# Patient Record
Sex: Female | Born: 1955 | Race: White | Hispanic: No | Marital: Married | Smoking: Never smoker
Health system: Southern US, Community
[De-identification: ages and names within clinical notes are randomized; demographics above are authoritative.]

## PROBLEM LIST (undated history)

## (undated) DIAGNOSIS — F419 Anxiety disorder, unspecified: Secondary | ICD-10-CM

## (undated) DIAGNOSIS — G43909 Migraine, unspecified, not intractable, without status migrainosus: Secondary | ICD-10-CM

## (undated) DIAGNOSIS — L570 Actinic keratosis: Secondary | ICD-10-CM

## (undated) DIAGNOSIS — G8929 Other chronic pain: Secondary | ICD-10-CM

## (undated) DIAGNOSIS — R519 Headache, unspecified: Secondary | ICD-10-CM

## (undated) DIAGNOSIS — R51 Headache: Secondary | ICD-10-CM

## (undated) HISTORY — DX: Other chronic pain: G89.29

## (undated) HISTORY — DX: Headache, unspecified: R51.9

## (undated) HISTORY — DX: Headache: R51

## (undated) HISTORY — DX: Actinic keratosis: L57.0

## (undated) HISTORY — DX: Migraine, unspecified, not intractable, without status migrainosus: G43.909

## (undated) HISTORY — DX: Anxiety disorder, unspecified: F41.9

---

## 1999-02-05 ENCOUNTER — Ambulatory Visit (HOSPITAL_COMMUNITY): Admission: RE | Admit: 1999-02-05 | Discharge: 1999-02-05 | Payer: Self-pay | Admitting: Urology

## 1999-02-05 ENCOUNTER — Encounter: Payer: Self-pay | Admitting: Urology

## 1999-02-14 ENCOUNTER — Encounter: Payer: Self-pay | Admitting: Urology

## 1999-02-14 ENCOUNTER — Ambulatory Visit (HOSPITAL_COMMUNITY): Admission: RE | Admit: 1999-02-14 | Discharge: 1999-02-14 | Payer: Self-pay | Admitting: Urology

## 1999-05-09 ENCOUNTER — Encounter: Payer: Self-pay | Admitting: Family Medicine

## 1999-05-09 LAB — CONVERTED CEMR LAB
RBC count: 4.13 10*6/uL
WBC, blood: 4.5 10*3/uL

## 2000-07-04 ENCOUNTER — Encounter: Payer: Self-pay | Admitting: Family Medicine

## 2000-07-04 LAB — CONVERTED CEMR LAB
RBC count: 4.17 10*6/uL
WBC, blood: 11.1 10*3/uL

## 2001-04-12 ENCOUNTER — Encounter: Payer: Self-pay | Admitting: Family Medicine

## 2001-04-12 LAB — CONVERTED CEMR LAB: Blood Glucose, Fasting: 91 mg/dL

## 2001-04-13 ENCOUNTER — Encounter: Payer: Self-pay | Admitting: Family Medicine

## 2001-04-13 LAB — CONVERTED CEMR LAB
RBC count: 3.98 10*6/uL
TSH: 1.72 microintl units/mL
WBC, blood: 11.7 10*3/uL

## 2001-06-03 ENCOUNTER — Encounter: Payer: Self-pay | Admitting: Family Medicine

## 2001-06-03 LAB — CONVERTED CEMR LAB: TSH: 1.74 microintl units/mL

## 2001-08-20 ENCOUNTER — Encounter: Payer: Self-pay | Admitting: Family Medicine

## 2001-08-20 LAB — CONVERTED CEMR LAB
RBC count: 4.14 10*6/uL
WBC, blood: 7.6 10*3/uL

## 2009-01-08 ENCOUNTER — Encounter: Payer: Self-pay | Admitting: Orthopedic Surgery

## 2009-01-29 ENCOUNTER — Encounter (INDEPENDENT_AMBULATORY_CARE_PROVIDER_SITE_OTHER): Payer: Self-pay | Admitting: Internal Medicine

## 2009-01-31 ENCOUNTER — Ambulatory Visit: Payer: Self-pay | Admitting: Family Medicine

## 2009-01-31 DIAGNOSIS — J309 Allergic rhinitis, unspecified: Secondary | ICD-10-CM | POA: Insufficient documentation

## 2009-01-31 DIAGNOSIS — R030 Elevated blood-pressure reading, without diagnosis of hypertension: Secondary | ICD-10-CM | POA: Insufficient documentation

## 2009-01-31 DIAGNOSIS — K5909 Other constipation: Secondary | ICD-10-CM | POA: Insufficient documentation

## 2009-02-06 ENCOUNTER — Encounter: Payer: Self-pay | Admitting: Family Medicine

## 2009-02-06 ENCOUNTER — Encounter: Payer: Self-pay | Admitting: Orthopedic Surgery

## 2009-02-06 DIAGNOSIS — G2581 Restless legs syndrome: Secondary | ICD-10-CM | POA: Insufficient documentation

## 2009-02-08 ENCOUNTER — Emergency Department: Payer: Self-pay | Admitting: Emergency Medicine

## 2009-03-08 ENCOUNTER — Encounter: Payer: Self-pay | Admitting: Orthopedic Surgery

## 2010-10-16 ENCOUNTER — Encounter: Payer: Self-pay | Admitting: Family Medicine

## 2010-11-05 NOTE — Letter (Signed)
Summary: [facility]   Imported By: Lanelle Bal 10/29/2010 13:09:14  _____________________________________________________________________  External Attachment:    Type:   Image     Comment:   External Document

## 2010-11-14 ENCOUNTER — Encounter: Payer: Self-pay | Admitting: Orthopedic Surgery

## 2010-12-08 ENCOUNTER — Encounter: Payer: Self-pay | Admitting: Orthopedic Surgery

## 2011-01-07 ENCOUNTER — Encounter: Payer: Self-pay | Admitting: Orthopedic Surgery

## 2011-06-23 ENCOUNTER — Encounter: Payer: Self-pay | Admitting: Orthopedic Surgery

## 2011-07-10 ENCOUNTER — Encounter: Payer: Self-pay | Admitting: Orthopedic Surgery

## 2011-08-09 ENCOUNTER — Encounter: Payer: Self-pay | Admitting: Orthopedic Surgery

## 2011-10-20 ENCOUNTER — Encounter: Payer: Self-pay | Admitting: Family Medicine

## 2011-10-20 DIAGNOSIS — G4700 Insomnia, unspecified: Secondary | ICD-10-CM | POA: Insufficient documentation

## 2012-02-17 ENCOUNTER — Ambulatory Visit: Payer: Self-pay | Admitting: Internal Medicine

## 2012-06-29 ENCOUNTER — Ambulatory Visit: Payer: Self-pay

## 2012-07-15 HISTORY — PX: CHOLECYSTECTOMY: SHX55

## 2013-08-23 ENCOUNTER — Ambulatory Visit: Payer: Self-pay | Admitting: Internal Medicine

## 2014-03-31 ENCOUNTER — Ambulatory Visit: Payer: Self-pay | Admitting: Physical Medicine and Rehabilitation

## 2014-08-22 ENCOUNTER — Ambulatory Visit: Payer: Self-pay | Admitting: Internal Medicine

## 2015-02-08 ENCOUNTER — Other Ambulatory Visit: Payer: Self-pay | Admitting: Neurology

## 2015-02-08 DIAGNOSIS — M542 Cervicalgia: Secondary | ICD-10-CM

## 2015-02-16 ENCOUNTER — Ambulatory Visit
Admission: RE | Admit: 2015-02-16 | Discharge: 2015-02-16 | Disposition: A | Discharge status: Home / Self Care | Payer: 59 | Source: Ambulatory Visit | Attending: Neurology | Admitting: Neurology

## 2015-02-16 DIAGNOSIS — R202 Paresthesia of skin: Secondary | ICD-10-CM | POA: Diagnosis not present

## 2015-02-16 DIAGNOSIS — M5412 Radiculopathy, cervical region: Secondary | ICD-10-CM | POA: Insufficient documentation

## 2015-02-16 DIAGNOSIS — M542 Cervicalgia: Secondary | ICD-10-CM

## 2015-04-05 ENCOUNTER — Ambulatory Visit: Payer: 59 | Attending: Physician Assistant

## 2015-04-05 DIAGNOSIS — M25552 Pain in left hip: Secondary | ICD-10-CM | POA: Insufficient documentation

## 2015-04-05 DIAGNOSIS — M7602 Gluteal tendinitis, left hip: Secondary | ICD-10-CM | POA: Diagnosis not present

## 2015-04-06 NOTE — Therapy (Signed)
[place] [facility] [address], [place], [address] Phone: [phone]   Fax:  [phone]  Physical Therapy Evaluation  Patient Details  Name: Alejandra Ward MRN: [id number] Date of Birth: 08/18/1956 Referring Provider:  Jarome Lamas, PA-C  Encounter Date: 04/05/2015      PT End of Session - 04/06/15 0947    Visit Number 1   Number of Visits 12   Date for PT Re-Evaluation 05/18/15   Authorization Type no g codes   PT Start Time 0800   PT Stop Time 0900   PT Time Calculation (min) 60 min   Activity Tolerance Patient limited by pain   Behavior During Therapy Anxious      Past Medical History  Diagnosis Date  . Anxiety   . Chronic headaches   . Migraines     History reviewed. No pertinent past surgical history.  There were no vitals filed for this visit.  Visit Diagnosis:  Gluteal tendinitis of left buttock - Plan: PT plan of care cert/re-cert  Hip pain, acute, left - Plan: PT plan of care cert/re-cert      Subjective Assessment - 04/05/15 0848    Subjective "My L hip hurts really bad"   Pertinent History Pt is a frequent daily walker. She reports that 2 weeks ago she walked 4 miles and the next morning she woke up and couldn't stand up due to severe L hip pain. Pain progressively worsened until she finally decided to go see MD. She went to [facility] on 04/03/15 Mobic and they injected her with Kenalog and prescribed Mobic. MD requested pt to return in 4 weeks for MRI if hip pain does not improve. Pt complains of severe L hip pain today but improving on the Mobic. History of bilateral SI and neck pain. Pt reports recent neck MRI which showed bone spurs. Pt denies numbness/tingling in either LE. Denies back pain. States the night when they performed the Kenalog injection she had some L foot numbness but it has subsided. Aggravating factors: sitting, sit to stand, heat, bending, and standing for  extended period of time. Easing factors: Mobic, ice, changing position. Pain is worse first thing in the AM and improves as the day progresses. Pain does not wake her up at night as long as she is taking the Mobic. ROS negative for red flags.    How long can you sit comfortably? 30 minutes if she is taking Mobic   How long can you walk comfortably? 15 minutes   Diagnostic tests None   Patient Stated Goals Decrease pain and return to walking program.    Currently in Pain? Yes   Pain Score 5    Pain Location Buttocks   Pain Orientation Left   Pain Descriptors / Indicators Sharp   Pain Type Acute pain   Pain Onset 1 to 4 weeks ago   Pain Frequency Constant   Multiple Pain Sites No            [facility] PT Assessment - 04/06/15 0957    Assessment   Medical Diagnosis Gluteal tendinitis of L buttock   Onset Date/Surgical Date 03/22/15   Next MD Visit Unscheduled, follow-up as needed   Prior Therapy Yes for bilateral SIJ pain, not for current condition   Precautions   Precautions None   Restrictions   Weight Bearing Restrictions No   Balance Screen   Has the patient fallen in the past 6 months No  Home Environment   Living Environment Private residence   Living Arrangements Spouse/significant other   Available Help at Discharge Family   Type of [place] to enter   Entrance Stairs-Number of Steps 2   Entrance Stairs-Rails None   Home Layout Two level   Alternate Level Stairs-Number of Steps 12   Alternate Level Stairs-Rails Left   Prior Function   Level of Independence Independent   Vocation Part time employment   Chief Technology Officer   Cognition   Overall Cognitive Status Within Functional Limits for tasks assessed   Observation/Other Assessments   Observations No gross abnormalities in posture noted   Other Surveys  Other Surveys   Lower Extremity Functional Scale  23   Sensation   Light Touch Appears Intact   Tone   Assessment Location Other  (comment)   Tone Assessment - Other   Other Tone Location Comments No clonus, spasticity, or rigidity noted in bilateral UE/LE. Negative Hoffman.   ROM / Strength   AROM / PROM / Strength Strength   Strength   Overall Strength Comments Unable to fully assess due to pain but is functional and appears intact. Painful resisted L hip ER and painful passive L hip IR. IR/ER is grossly symmetrical bilateral. Knee and ankle AROM is grossly WNL. LE strength is at least 4 to 4+/5 bilateral without focal weakness noted.   Flexibility   Soft Tissue Assessment /Muscle Length yes   Hamstrings Approximately 70 degrees bilateral and painless. L knee to chest painful   Palpation   Spinal mobility Lumbar CPA testing is painless and relatively normal. Sacral thrust is negative. Unable to tolerate sacral distraction. No pain over SIJ or forntin area bilaterally with palpation.    Palpation comment Painful palpation along L posterolateral hip in area of glut med/max. Pain with palpation of deep L hip external rotators. Pt reports radiating pain to anterior L hip. Pt with poor tolerance to palpation.    Ambulation/Gait   Gait Comments L antalgic gait pattern   Balance   Balance Assessed No                   [facility] Adult PT Treatment/Exercise - 04/06/15 1010    Modalities   Modalities Electrical Stimulation   Electrical Stimulation   Electrical Stimulation Location Posterolateral L buttock, 2 leads   Electrical Stimulation Action HiVolt to reduce muscle spasms   Electrical Stimulation Parameters 115-120V, 20 minutes with concurrent MHP   Electrical Stimulation Goals Pain                PT Education - 04/06/15 0946    Education provided Yes   Education Details Educated about pain-free gentle piriformis stretch. Pt elects not to perform at home. Education about pain control.    Person(s) Educated Patient   Methods Explanation;Demonstration   Comprehension Verbalized understanding;Need  further instruction             PT Long Term Goals - 04/06/15 0955    PT LONG TERM GOAL #1   Title Pt will report full resolution of L hip pain with all exercise in order to return to walking program pain free by 05/18/15   Status New   PT LONG TERM GOAL #2   Title Pt will be independent with HEP for self-management of L hip pain by 05/18/15   Status New   PT LONG TERM GOAL #3   Title Pt will demonstrate increase in LEFS  by at least 9 points to demonstrate clinically significant change in pain and improvement in function at home by 05/18/15   Baseline 04/05/15: 23/80   Status New               Plan - 04/06/15 0949    Clinical Impression Statement Pt is a 59 year-old Caucasian female referred for gluteal tendinitis of left buttock. PT examination is somewhat limite due to high level of pain and poor tolerance by patient. Pt does have pain will palpation along L posterolateral and posterior buttock including deep external rotators. Pain with resisted L hip external rotation and passive L hip internal rotation. Pt will benefit from skilled PT services for pain control until she can progress to hip abductor and external rotator strengthening.    Pt will benefit from skilled therapeutic intervention in order to improve on the following deficits Abnormal gait;Pain   Rehab Potential Good   Clinical Impairments Affecting Rehab Potential Positive: motivation, active lifestyle; Negative: poor pain tolerance   PT Frequency 2x / week   PT Duration 4 weeks   PT Treatment/Interventions Aquatic Therapy;Electrical Stimulation;Cryotherapy;Iontophoresis 4mg /ml Dexamethasone;Moist Heat;Traction;Ultrasound;Gait training;Stair training;Therapeutic activities;Therapeutic exercise;Balance training;Neuromuscular re-education;Manual techniques;Passive range of motion   PT Next Visit Plan Pain control modalities until pt can tolerate gentle ROM of hip progressing toward strengthening   PT Home Exercise Plan  Gentle, pain-free supine L knee to chest and L hip IR stretch, pt refuses   Consulted and Agree with Plan of Care Patient         Problem List Patient Active Problem List   Diagnosis Date Noted  . Insomnia 10/20/2011  . RESTLESS LEGS SYNDROME 02/06/2009  . ALLERGIC RHINITIS 01/31/2009  . CONSTIPATION, RECURRENT 01/31/2009  . ELEVATED BLOOD PRESSURE WITHOUT DIAGNOSIS OF HYPERTENSION 01/31/2009   Phillips Grout PT, DPT   Huprich,Jason 04/06/2015, 10:13 AM  [place] [facility] [address] [place], [address] Phone: [phone]   Fax:  [phone]

## 2015-04-09 ENCOUNTER — Encounter: Payer: Self-pay | Admitting: Physical Therapy

## 2015-04-09 ENCOUNTER — Ambulatory Visit: Payer: 59 | Attending: Physician Assistant | Admitting: Physical Therapy

## 2015-04-09 DIAGNOSIS — M6281 Muscle weakness (generalized): Secondary | ICD-10-CM | POA: Insufficient documentation

## 2015-04-09 DIAGNOSIS — M25552 Pain in left hip: Secondary | ICD-10-CM

## 2015-04-10 NOTE — Therapy (Signed)
[place] [facility] [address], [place], [address] Phone: [phone]   Fax:  [phone]  Physical Therapy Treatment  Patient Details  Name: Alejandra Ward MRN: [id number] Date of Birth: 26-Apr-1956 Referring Provider:  Jarome Lamas, PA-C  Encounter Date: 04/09/2015      PT End of Session - 04/09/15 1700    Visit Number 2   Number of Visits 12   Date for PT Re-Evaluation 05/18/15   PT Start Time 6283   PT Stop Time 1652   PT Time Calculation (min) 39 min   Activity Tolerance Patient tolerated treatment well   Behavior During Therapy [facility] for tasks assessed/performed      Past Medical History  Diagnosis Date  . Anxiety   . Chronic headaches   . Migraines     History reviewed. No pertinent past surgical history.  There were no vitals filed for this visit.  Visit Diagnosis:  Hip pain, acute, left  Muscle weakness      Subjective Assessment - 04/09/15 1617    Subjective Patient reports she is "much better" and is now able to walk more. She was able to walk about a mile on the treadmill ~3 mph (25 min.). She is decreasing medication dosage of Mobic and that seems to be doing well without increased pain.    Limitations Sitting   How long can you walk comfortably? 25 min.    Patient Stated Goals Decrease pain and return to walking program.    Currently in Pain? Yes   Pain Score 2    Pain Location Other (Comment)  left lateral hip into groin area   Pain Orientation Left   Pain Descriptors / Indicators Aching   Pain Type Acute pain   Pain Onset More than a month ago   Pain Frequency Intermittent   Multiple Pain Sites No     Objective Gait: WNL's without antalgic pattern noted today ROM/flexibiltiy assessment: mild decreased ER and hip abduction left as compared to right Strength: able to perform hip abduction and ER in side lying with left hip today (no resistance) with mild soreness reported  (could not raise left LE previous session due to pain)      [facility] Adult PT Treatment/Exercise - 04/09/15 1623    Exercises   Exercises Other Exercises   Other Exercises  Side lying clam with manual resistance x 10 reps, standing hip extension and abduction 3 x 5 reps, hip extension with bridging x 10 with ball between knees with verbal cuing   Modalities   Modalities Ultrasound x 10 min. Pulsed 2 1.4 w/cm2 to lateral aspect left hip with patient in side lying right with pillow between knees. Goals: pain, muscle spasms    Manual techniques: soft tissue mobilization lateral left hip with patient in side lying right: superficial techniques only, increased spasms palpable, followed by [place]   Patient response to treatment: improved soft tissue elasticity, decreased spasms and improved strength with less soreness noted in left hip with repetition and guided motion         PT Education - 04/09/15 1700    Education provided Yes   Education Details Instructed in progression of exercises for stabilization of core, begin increasing ROM and strengthening of left hip   Person(s) Educated Patient   Methods Explanation;Verbal cues;Handout   Comprehension Verbalized understanding;Returned demonstration;Verbal cues required             PT Long Term Goals -  04/06/15 0955    PT LONG TERM GOAL #1   Title Pt will report full resolution of L hip pain with all exercise in order to return to walking program pain free by 05/18/15   Status New   PT LONG TERM GOAL #2   Title Pt will be independent with HEP for self-management of L hip pain by 05/18/15   Status New   PT LONG TERM GOAL #3   Title Pt will demonstrate increase in LEFS by at least 9 points to demonstrate clinically significant change in pain and improvement in function at home by 05/18/15   Baseline 04/05/15: 23/80   Status New               Plan - 04/09/15 1701    Clinical Impression Statement Patient is much improved with decreased  left hip pain from previous session. She is now able to progress with exercises and return to prior level of exercises with guided progression and modification as indicated. She responded well to treatment with [place] and exercises and should continue to steadily improve with addiitional physical therapy intervention.    Pt will benefit from skilled therapeutic intervention in order to improve on the following deficits Pain;Decreased strength   Rehab Potential Good   PT Frequency 2x / week   PT Duration 4 weeks   PT Treatment/Interventions Patient/family education;Ultrasound;Moist Heat;Electrical Stimulation;Therapeutic exercise;Manual techniques;Cryotherapy   PT Next Visit Plan pain control, manual therapy STM, progress therapeutic exercises for core/hip strengthening/flexiblity        Problem List Patient Active Problem List   Diagnosis Date Noted  . Insomnia 10/20/2011  . RESTLESS LEGS SYNDROME 02/06/2009  . ALLERGIC RHINITIS 01/31/2009  . CONSTIPATION, RECURRENT 01/31/2009  . ELEVATED BLOOD PRESSURE WITHOUT DIAGNOSIS OF HYPERTENSION 01/31/2009    Jomarie Longs PT 04/10/2015, 3:06 PM  [place] [facility] [address] [place], [address] Phone: [phone]   Fax:  [phone]

## 2015-04-12 ENCOUNTER — Encounter: Payer: Self-pay | Admitting: Physical Therapy

## 2015-04-12 ENCOUNTER — Ambulatory Visit: Payer: 59 | Admitting: Physical Therapy

## 2015-04-12 DIAGNOSIS — M6281 Muscle weakness (generalized): Secondary | ICD-10-CM

## 2015-04-12 DIAGNOSIS — M25552 Pain in left hip: Secondary | ICD-10-CM

## 2015-04-12 NOTE — Therapy (Signed)
[place] [facility] [address], [place], [address] Phone: [phone]   Fax:  [phone]  Physical Therapy Treatment  Patient Details  Name: Alejandra Ward MRN: [id number] Date of Birth: 11-Nov-1955 Referring Provider:  Jarome Lamas, PA-C  Encounter Date: 04/12/2015      PT End of Session - 04/12/15 1021    Visit Number 3   Number of Visits 12   Date for PT Re-Evaluation 05/18/15   PT Start Time 0948   PT Stop Time 1019   PT Time Calculation (min) 31 min   Activity Tolerance Patient tolerated treatment well   Behavior During Therapy [facility] for tasks assessed/performed      Past Medical History  Diagnosis Date  . Anxiety   . Chronic headaches   . Migraines     History reviewed. No pertinent past surgical history.  There were no vitals filed for this visit.  Visit Diagnosis:  Hip pain, acute, left  Muscle weakness      Subjective Assessment - 04/12/15 0951    Subjective Paitent continues to see great improvement with decreased pain in lateral aspect of left hip. She has walked 2 miles today already and is having only soreness today 2/10.    Currently in Pain? Yes   Pain Score 2    Pain Location Hip   Pain Orientation Left   Pain Descriptors / Indicators Aching;Sore   Pain Type Acute pain   Pain Onset More than a month ago   Pain Frequency Intermittent      Objective: Strength: decreased hip abduction and ER strength with soreness noted left hip        [facility] Adult PT Treatment/Exercise - 04/12/15 0953    Exercises   Exercises Other Exercises   Other Exercises  Side lying clam x 10 reps each side, standing hip extension and abduction 3 x 5 reps, Treadmill x 2 min. To observe gait pattern with equal weight shift and step length noted, side stepping along foam balance beam x 2 min., diagonal walk x 10 steps with verbal cuing and demonstration   Modalities   Modalities ;Ultrasound  (performed following exercises)   Ultrasound   Ultrasound Location left hip/lateral aspect into gluteal region   Ultrasound Parameters 1MHz 100% @ 1.4w/cm2 with patient side lying right with pillow between knees      Patient response to treatment: good technique with exercises with demonstration/verbal cuing, sore in left hip with all exercises, decreased soreness following [place] to mild and able to tolerate increased resistance into ER following [place]               PT Long Term Goals - 04/06/15 0955    PT LONG TERM GOAL #1   Title Pt will report full resolution of L hip pain with all exercise in order to return to walking program pain free by 05/18/15   Status New   PT LONG TERM GOAL #2   Title Pt will be independent with HEP for self-management of L hip pain by 05/18/15   Status New   PT LONG TERM GOAL #3   Title Pt will demonstrate increase in LEFS by at least 9 points to demonstrate clinically significant change in pain and improvement in function at home by 05/18/15   Baseline 04/05/15: 23/80   Status New               Plan - 04/12/15 1023    Clinical  Impression Statement Patient continues with weakness and pain in left hip with good response to [place] and progressing with exercises without resistance.    Pt will benefit from skilled therapeutic intervention in order to improve on the following deficits Pain;Decreased strength   Rehab Potential Good   PT Frequency 2x / week   PT Duration 4 weeks   PT Treatment/Interventions Patient/family education;Ultrasound;Moist Heat;Electrical Stimulation;Therapeutic exercise;Manual techniques;Cryotherapy   PT Next Visit Plan pain control, manual therapy STM, progress therapeutic exercises for core/hip strengthening/flexiblity        Problem List Patient Active Problem List   Diagnosis Date Noted  . Insomnia 10/20/2011  . RESTLESS LEGS SYNDROME 02/06/2009  . ALLERGIC RHINITIS 01/31/2009  . CONSTIPATION, RECURRENT 01/31/2009  .  ELEVATED BLOOD PRESSURE WITHOUT DIAGNOSIS OF HYPERTENSION 01/31/2009    Jomarie Longs PT 04/12/2015, 10:24 AM  [place] [facility] [address], [place], [address] Phone: [phone]   Fax:  [phone]

## 2015-04-16 ENCOUNTER — Ambulatory Visit: Payer: 59 | Attending: Physician Assistant | Admitting: Physical Therapy

## 2015-04-16 ENCOUNTER — Encounter: Payer: Self-pay | Admitting: Physical Therapy

## 2015-04-16 ENCOUNTER — Encounter: Payer: 59 | Admitting: Physical Therapy

## 2015-04-16 DIAGNOSIS — M25552 Pain in left hip: Secondary | ICD-10-CM | POA: Insufficient documentation

## 2015-04-16 DIAGNOSIS — M6281 Muscle weakness (generalized): Secondary | ICD-10-CM | POA: Insufficient documentation

## 2015-04-16 NOTE — Therapy (Signed)
[place] [facility] [address], [place], [address] Phone: [phone]   Fax:  [phone]  Physical Therapy Treatment  Patient Details  Name: Alejandra Ward MRN: [id number] Date of Birth: 03/11/1956 Referring Provider:  Jarome Lamas, PA-C  Encounter Date: 04/16/2015      PT End of Session - 04/16/15 1937    Visit Number 4   Number of Visits 12   Date for PT Re-Evaluation 05/18/15   PT Start Time 1855   PT Stop Time 1935   PT Time Calculation (min) 40 min   Activity Tolerance Patient tolerated treatment well   Behavior During Therapy [facility] for tasks assessed/performed      Past Medical History  Diagnosis Date  . Anxiety   . Chronic headaches   . Migraines     History reviewed. No pertinent past surgical history.  There were no vitals filed for this visit.  Visit Diagnosis:  Hip pain, acute, left  Muscle weakness      Subjective Assessment - 04/16/15 1857    Subjective Patient reports she is feeling better overall in left hip and LE. She did do some light aerobics Saturday and had a flare up with right knee with swelling.    Limitations Sitting   Patient Stated Goals Decrease pain and return to walking program.    Currently in Pain? Yes   Pain Score 3    Pain Location Hip   Pain Orientation Left   Pain Descriptors / Indicators Aching;Sore   Pain Type Acute pain   Pain Onset More than a month ago   Pain Frequency Intermittent       Objective: Palpation: left lateral hip/gluteal muscle: + spasm with TP in gluteus medius muscle Strength: decreased strength by 30% in left hip abduction and ER as compared to right hip       [facility] Adult PT Treatment/Exercise - 04/16/15 2226    Exercises   Exercises Other Exercises   Other Exercises  standing diagonal walk with 3# weights x 2 min. TKE in stanidng with green resistive band, side lying clam with mild resistance x 10 reps, assisted side lying  left hip abduction with hold at end range and eccentric conrol with assistance through 50% ROM x 3-5 reps   Modalities   Modalities Ultrasound   Ultrasound   Ultrasound Location left lateral hip/gluteal muscles   Ultrasound Parameters 3MHz pulsed 20% over TP's 0.8w/cm2 x 3 min. and 1MHz pulsed @ 50% over lateral aspect of left hip/gluteal region x 10 min   Ultrasound Goals Pain   Manual Therapy   Manual Therapy Soft tissue mobilization   Soft tissue mobilization left hip/gluteal muscles with concentration on gluteus medius muscle      Patient response to treatment: decreased spasms with 50% less tenderness elicited following [place] treatment, allowing STM to improve elasticity, overall reported soreness in left hip following session; patient required verbal cuing and assistance to complete exercises for ER and hip abduction in side lying position          PT Education - 04/16/15 1935    Education provided Yes   Education Details instructed in short arc exercises for TKE and side lying hip abduciton    Person(s) Educated Patient   Methods Explanation;Demonstration;Verbal cues   Comprehension Verbalized understanding;Returned demonstration;Verbal cues required             PT Long Term Goals - 04/06/15 0955    PT  LONG TERM GOAL #1   Title Pt will report full resolution of L hip pain with all exercise in order to return to walking program pain free by 05/18/15   Status New   PT LONG TERM GOAL #2   Title Pt will be independent with HEP for self-management of L hip pain by 05/18/15   Status New   PT LONG TERM GOAL #3   Title Pt will demonstrate increase in LEFS by at least 9 points to demonstrate clinically significant change in pain and improvement in function at home by 05/18/15   Baseline 04/05/15: 23/80   Status New               Plan - 04/16/15 1940    Clinical Impression Statement Patient demonstrates good progress towards goals for left hip pain and spasms. She  demonstrated decresased pain to mild and decreased muscle spasms by >50% with [place] and STM. She is still weak and has decreased control with left hip abduciton and will require additional psysical therapy intervention to return to prior level of function.    Pt will benefit from skilled therapeutic intervention in order to improve on the following deficits Pain;Decreased strength   Rehab Potential Good   PT Frequency 2x / week   PT Duration 4 weeks   PT Treatment/Interventions Patient/family education;Ultrasound;Moist Heat;Electrical Stimulation;Therapeutic exercise;Manual techniques;Cryotherapy   PT Next Visit Plan pain control, manual therapy STM, progress therapeutic exercises for core/hip strengthening/flexiblity        Problem List Patient Active Problem List   Diagnosis Date Noted  . Insomnia 10/20/2011  . RESTLESS LEGS SYNDROME 02/06/2009  . ALLERGIC RHINITIS 01/31/2009  . CONSTIPATION, RECURRENT 01/31/2009  . ELEVATED BLOOD PRESSURE WITHOUT DIAGNOSIS OF HYPERTENSION 01/31/2009    Jomarie Longs PT 04/16/2015, 10:48 PM  [place] [facility] [address] [place], [address] Phone: [phone]   Fax:  [phone]

## 2015-04-18 ENCOUNTER — Encounter: Payer: 59 | Admitting: Physical Therapy

## 2015-04-19 ENCOUNTER — Encounter: Payer: Self-pay | Admitting: Physical Therapy

## 2015-04-19 ENCOUNTER — Ambulatory Visit: Payer: 59 | Admitting: Physical Therapy

## 2015-04-19 DIAGNOSIS — M25552 Pain in left hip: Secondary | ICD-10-CM

## 2015-04-19 DIAGNOSIS — M6281 Muscle weakness (generalized): Secondary | ICD-10-CM

## 2015-04-20 ENCOUNTER — Encounter: Payer: 59 | Admitting: Physical Therapy

## 2015-04-20 NOTE — Therapy (Signed)
[facility] [address], [place], [address] Phone: [phone]   Fax:  [phone]  Physical Therapy Treatment  Patient Details  Name: Alejandra Ward MRN: [id number] Date of Birth: 12/26/1955 Referring Provider:  Jarome Lamas, PA-C  Encounter Date: 04/19/2015      PT End of Session - 04/19/15 1620    Visit Number 5   Number of Visits 12   Date for PT Re-Evaluation 05/18/15   PT Start Time 0258   PT Stop Time 1620   PT Time Calculation (min) 36 min   Activity Tolerance Patient tolerated treatment well   Behavior During Therapy [facility] for tasks assessed/performed      Past Medical History  Diagnosis Date  . Anxiety   . Chronic headaches   . Migraines     History reviewed. No pertinent past surgical history.  There were no vitals filed for this visit.  Visit Diagnosis:  Hip pain, acute, left  Muscle weakness      Subjective Assessment - 04/19/15 1552    Subjective Patient reports she is feeling better overall in left hip and LE. She is still sore in left hip and feels overall a lot of improvement since injection and with therapy intervention.   Patient Stated Goals Decrease pain and return to walking program.    Currently in Pain? Yes   Pain Score 3    Pain Location Hip   Pain Orientation Left   Pain Descriptors / Indicators Aching;Sore   Pain Type Acute pain   Pain Onset More than a month ago          [facility] Adult PT Treatment/Exercise - 04/19/15 1552    Exercises   Exercises Other Exercises   Other Exercises  Re assessed home program: standing diagonal walk with 3# weights x 2 min. TKE in stanidng with green resistive band, side lying clam with mild resistance x 10 reps, assisted side lying left hip abduction with hold at end range and eccentric conrol with assistance through 50% ROM x 3-5 reps Added hip abduction isometric exercise against wall with demonstration and verbal cuing 5  x 5 seconds each LE   Modalities   Modalities Ultrasound   Ultrasound   Ultrasound parameters: Goals 1MHZ pulsed 50% x 10 min. @ 1.4w/cm2 to lateral left hip/gluteus maximus/medius region with patient in right side lying with pillow between knees Pain   Manual Therapy   Manual Therapy Soft tissue mobilization   Soft tissue mobilization left hip/gluteal muscles with concentration on gluteus medius muscle with patient in right side lying followed by [place]      patient response to treatment: decreased spasms to mild with mild tenderness noted on palpation following [place], demonstrates good understanding of home exercises and new exercises following demonstration and with verbal cuing          PT Education - 04/19/15 1620    Education provided Yes   Education Details reassessed home exercises for hip extension, ER and abduction with verbal cuing as needed, added hip abduction against wall 5 x 5 seconds   Person(s) Educated Patient   Methods Explanation;Verbal cues   Comprehension Verbalized understanding;Returned demonstration             PT Long Term Goals - 04/06/15 0955    PT LONG TERM GOAL #1   Title Pt will report full resolution of L hip pain with all exercise in order to return to walking program  pain free by 05/18/15   Status New   PT LONG TERM GOAL #2   Title Pt will be independent with HEP for self-management of L hip pain by 05/18/15   Status New   PT LONG TERM GOAL #3   Title Pt will demonstrate increase in LEFS by at least 9 points to demonstrate clinically significant change in pain and improvement in function at home by 05/18/15   Baseline 04/05/15: 23/80   Status New               Plan - 04/19/15 1621    Clinical Impression Statement Patient is progressing well with decreased soreness and imrpoving strength in left hip overall. She is responding favorably to [facility] and [place] and with independent home exercises as instructed and guided by therapist. She continues with  spasms and TP's in left gluteal muscles and will benefit from additional physical therapy intervention to further decrease spasms and pain in order for her to self manage pain and home exercises.    Pt will benefit from skilled therapeutic intervention in order to improve on the following deficits Pain;Decreased strength   Rehab Potential Good   PT Frequency 2x / week   PT Duration 4 weeks   PT Treatment/Interventions Patient/family education;Ultrasound;Moist Heat;Electrical Stimulation;Therapeutic exercise;Manual techniques;Cryotherapy   PT Next Visit Plan pain control, manual therapy STM, progress therapeutic exercises for core/hip strengthening/flexiblity        Problem List Patient Active Problem List   Diagnosis Date Noted  . Insomnia 10/20/2011  . RESTLESS LEGS SYNDROME 02/06/2009  . ALLERGIC RHINITIS 01/31/2009  . CONSTIPATION, RECURRENT 01/31/2009  . ELEVATED BLOOD PRESSURE WITHOUT DIAGNOSIS OF HYPERTENSION 01/31/2009    Jomarie Longs PT 04/20/2015, 4:36 PM  [facility] [address] [place], [address] Phone: [phone]   Fax:  [phone]

## 2015-04-23 ENCOUNTER — Encounter: Payer: Self-pay | Admitting: Physical Therapy

## 2015-04-23 ENCOUNTER — Ambulatory Visit: Payer: 59 | Admitting: Physical Therapy

## 2015-04-23 DIAGNOSIS — M25552 Pain in left hip: Secondary | ICD-10-CM | POA: Diagnosis not present

## 2015-04-23 DIAGNOSIS — M6281 Muscle weakness (generalized): Secondary | ICD-10-CM

## 2015-04-24 NOTE — Therapy (Signed)
[facility] [address], [place], [address] Phone: [phone]   Fax:  [phone]  Physical Therapy Treatment/Discharge summary  Patient Details  Name: Alejandra Ward MRN: [id number] Date of Birth: 19-Jul-1956 Referring Provider:  Jarome Lamas, PA-C  Encounter Date: 04/23/2015      PT End of Session - 04/23/15 1610    Visit Number 6   Number of Visits 12   Date for PT Re-Evaluation 05/18/15   PT Start Time 5361   PT Stop Time 1605   PT Time Calculation (min) 35 min   Activity Tolerance Patient tolerated treatment well   Behavior During Therapy [facility] for tasks assessed/performed      Past Medical History  Diagnosis Date  . Anxiety   . Chronic headaches   . Migraines     History reviewed. No pertinent past surgical history.  There were no vitals filed for this visit.  Visit Diagnosis:  Hip pain, acute, left  Muscle weakness      Subjective Assessment - 04/23/15 1534    Subjective Patient reports she is feeling better overall in left hip and LE. She is still sore in left hip and feels overall a lot of improvement since injection and with therapy intervention. She is consistent with home exercises and agrees to discharge to home program and self management. She walked 2+ miles on treadmill with mild discomfort in hip and is progressing towards return to prior level of walking.    Patient Stated Goals Decrease pain and return to walking program.    Currently in Pain? Yes   Pain Score 2    Pain Location Hip   Pain Orientation Left   Pain Descriptors / Indicators Aching;Sore   Pain Onset More than a month ago   Pain Frequency Intermittent     Objective:  LEFS: 70.5/80  (80 = no self perceived disability) initially was 23/80 Palpation: point tender over lateral left hip/gluteus medius/piriformis region, significant decreased over the past week Strength: mild decrease in strength and non  painful as compared to right side hip ER and abduction Gait: WNL's and non painful       [facility] Adult PT Treatment/Exercise - 04/23/15 1536    Exercises   Exercises Other Exercises   Other Exercises   reviewed verbally and with demonstration: standing diagonal walk  x 2 min. TKE in stanidng with green resistive band, side lying clam with resistive band x 10 reps   Modalities   Modalities Ultrasound   Ultrasound   Ultrasound parameters and Goals Lateral left hip 50% pulsed 1.4w/cm2 x 10 min. To gluteus medius region and piriformis region with patient in side lying right Pain   Manual Therapy   Manual Therapy Soft tissue mobilization   Soft tissue mobilization left hip/gluteal muscles with concentration on gluteus medius muscle TP/tight bands      Patient response to treatment: decreased tenderness and TP to mild tenderness with treatment and able to accurately verbalize and demonstrate home exercise program without verbal cuing          PT Education - 04/23/15 1610    Education provided Yes   Education Details Reassessed home exercises to continue for self management of left hip pain and strengthening   Person(s) Educated Patient   Methods Explanation   Comprehension Verbalized understanding             PT Long Term Goals - 04/23/15 1609    PT  LONG TERM GOAL #1   Title Pt will report full resolution of L hip pain with all exercise in order to return to walking program pain free by 05/18/15   Status Achieved   PT LONG TERM GOAL #2   Title Pt will be independent with HEP for self-management of L hip pain by 05/18/15   Status Achieved   PT LONG TERM GOAL #3   Title Pt will demonstrate increase in LEFS by at least 9 points to demonstrate clinically significant change in pain and improvement in function at home by 05/18/15   Baseline 04/05/15: 23/80  Current 70.5/80 demonstrating significant change with therapy intervention   Status Achieved               Plan -  04/23/15 1610    Clinical Impression Statement Patient has achieved all goals and is independent with self management of symptoms/exercises to continue to see results with goal of return to prior level of function. Patient agrees and is ready for discharge to home program. LEFS = 70.5/80.    Pt will benefit from skilled therapeutic intervention in order to improve on the following deficits Pain;Decreased strength   Rehab Potential Good   PT Frequency 2x / week   PT Duration 4 weeks   PT Treatment/Interventions Patient/family education;Ultrasound;Moist Heat;Electrical Stimulation;Therapeutic exercise;Manual techniques;Cryotherapy        Problem List Patient Active Problem List   Diagnosis Date Noted  . Insomnia 10/20/2011  . RESTLESS LEGS SYNDROME 02/06/2009  . ALLERGIC RHINITIS 01/31/2009  . CONSTIPATION, RECURRENT 01/31/2009  . ELEVATED BLOOD PRESSURE WITHOUT DIAGNOSIS OF HYPERTENSION 01/31/2009    Jomarie Longs PT 04/24/2015, 5:46 PM  [facility] [address], [place], [address] Phone: [phone]   Fax:  [phone]

## 2015-04-26 ENCOUNTER — Encounter: Payer: 59 | Admitting: Physical Therapy

## 2015-04-30 ENCOUNTER — Encounter: Payer: 59 | Admitting: Physical Therapy

## 2015-05-02 ENCOUNTER — Encounter: Payer: 59 | Admitting: Physical Therapy

## 2016-06-05 ENCOUNTER — Encounter: Payer: Self-pay | Admitting: Physical Therapy

## 2016-06-10 ENCOUNTER — Ambulatory Visit: Payer: 59 | Attending: Orthopedic Surgery | Admitting: Physical Therapy

## 2016-06-10 ENCOUNTER — Encounter: Payer: Self-pay | Admitting: Physical Therapy

## 2016-06-10 DIAGNOSIS — M546 Pain in thoracic spine: Secondary | ICD-10-CM | POA: Diagnosis present

## 2016-06-10 DIAGNOSIS — M6281 Muscle weakness (generalized): Secondary | ICD-10-CM | POA: Diagnosis present

## 2016-06-10 DIAGNOSIS — M25552 Pain in left hip: Secondary | ICD-10-CM | POA: Diagnosis not present

## 2016-06-10 DIAGNOSIS — M62838 Other muscle spasm: Secondary | ICD-10-CM | POA: Diagnosis present

## 2016-06-11 NOTE — Therapy (Signed)
[facility] [address] [place], [address] Phone: [phone]   Fax:  [phone]  Physical Therapy Evaluation  Patient Details  Name: Alejandra Ward MRN: [id number] Date of Birth: Nov 12, 1955 No Data Recorded  Encounter Date: 06/10/2016      PT End of Session - 06/10/16 1945    Visit Number 1   Number of Visits 12   Date for PT Re-Evaluation 07/23/16   PT Start Time 1845   PT Stop Time 1945   PT Time Calculation (min) 60 min   Activity Tolerance Patient tolerated treatment well   Behavior During Therapy [facility] for tasks assessed/performed      Past Medical History:  Diagnosis Date  . Anxiety   . Chronic headaches   . Migraines     History reviewed. No pertinent surgical history.  There were no vitals filed for this visit.       Subjective Assessment - 06/10/16 1851    Subjective Patient began with left hip pain and neck/back when cleaning with poor posture at counter top. She is currently improving slowly and has intermittent symptoms along left side upper and lower back including spasms and sharp pains.    Pertinent History Patient reports that 05/10/2016 she was cleaning house and injured left side of lower back/left hip and neck/upper back. She sought medical care through chiropractor, medication and is now referred to physical therapy to assist with pain and limited mobility and function.   Limitations Sitting;Other (comment)  rising from sit to stand   Patient Stated Goals decrease pain and return to normal activities   Currently in Pain? Yes   Pain Score 4    Pain Location Hip  upper back/neck   Pain Orientation Left   Pain Descriptors / Indicators Aching;Sore;Tightness   Pain Type Acute pain   Pain Onset More than a month ago   Pain Frequency Intermittent   Aggravating Factors  movement, walking, rising from sitting   Pain Relieving Factors medication, rest   Effect of Pain on  Daily Activities limits sleeping, self care, sitting, walking            [facility] PT Assessment - 06/10/16 1856      Assessment   Medical Diagnosis Gluteal tendinitis of L buttock/left hip pain/SIJ pain   Onset Date/Surgical Date 05/11/16   Next MD Visit Unscheduled, follow-up as needed   Prior Therapy Yes for bilateral SIJ pain, not for current condition     Precautions   Precautions None     Restrictions   Weight Bearing Restrictions No     Home Environment   Living Environment Private residence   Living Arrangements Spouse/significant other   Available Help at Discharge Family   Type of [place] to enter   Entrance Stairs-Number of Steps 2   Entrance Stairs-Rails None   Home Layout Two level   Alternate Level Stairs-Number of Steps 12   Alternate Level Stairs-Rails Left     Prior Function   Level of Independence Independent   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 IR/ER is grossly symmetrical bilateral. Knee and ankle AROM is grossly WNL. LE strength is at least 4 to 4+/5 bilateral without focal weakness noted.     Flexibility   Soft Tissue Assessment /Muscle Length yes   Hamstrings Approximately 70 degrees bilateral and non painful     Palpation   Spinal mobility PA mobility grossly WNL' s with mild to moderate discomfort along thoracic spine, T2-7; no increased pain along lumbar spine, + reproduction of symptoms with SIJ compression left side   Palpation comment Painful  palpation along L posterolateral hip in area of glut med/max SIJ left and piriformis. Increased spasms along thoracic spine left side lower T spine     Ambulation/Gait   Gait Comments L antalgic gait pattern              AROM: lumbar spine: flexion min. Decrease, extension moderate decrease with increased left side pain, lateral flexion right with increased left side pain  Treatment:  Modalities:  Ultrasound pulsed 50% 1 MHz to left side gluteal region/SIJ with patient prone lying x 10 min. Goal: decrease pain, improve mobility Electrical stimulation to thoracic spine bilateral aspect with patient prone with moist heat to same x 20 min.goal; reduce spasms and pain  Patient response to treatment: decreased pain and spasms in thoracic spine and left hip/gluteal region 25% and improved rising to stand with less difficulty         PT Education - 06/10/16 1854    Education provided Yes   Education Details hip adduction, hip abduction with resistive band   Person(s) Educated Patient   Methods Explanation;Demonstration;Verbal cues   Comprehension Verbalized understanding;Verbal cues required;Returned demonstration             PT Long Term Goals - 06/10/16 2000      PT LONG TERM GOAL #1   Title Pt will report 0/10 L hip/SIJ pain and upper back pain with rising from sitting and sitting in order to return to PLOF by 07/23/2016   Baseline pain level up to 5/10 with rising from sit, sitting,    Status New     PT LONG TERM GOAL #2   Title Pt will be independent with HEP for self-management of back and SIJ/hip by 07/23/2016   Baseline requires assistance, guidance for appropriate pain control strategies, exercises and progression   Status New     PT LONG TERM GOAL #3   Title Patient will demonstrate improved function with daily activites with mild self perceived disability as indicated by MODI of  20% or less by 07/23/2016   Baseline MODI = 34%   Status New                Plan - 06/10/16 1950    Clinical Impression Statement patient is a 60 year old right hand dominant female who presents with acute pain in left lower back/hip/SIJ and upper back pain following injury 05/10/2016 when reaching forward while propped on countertop. She is currently improving slowly since injection into left hip region. She continues with limitations of prolonged sitting, rising from sit, walking and difficulty with sleeping. She will benefit from physical therapy intervention for pain control, progressive exercises in order to return to prior level of function.   Rehab Potential Good   Clinical Impairments Affecting Rehab Potential Positive: motivation, active lifestyle; Negative: poor  pain tolerance   PT Frequency 2x / week   PT Duration 6 weeks   PT Treatment/Interventions Patient/family education;Ultrasound;Moist Heat;Electrical Stimulation;Therapeutic exercise;Manual techniques;Cryotherapy   PT Next Visit Plan pain control, manual therapy STM, progress therapeutic exercises for core/hip strengthening/flexiblity   PT Home Exercise Plan pain control strategies, ROM, gentle stretching   Consulted and Agree with Plan of Care Patient      Patient will benefit from skilled therapeutic intervention in order to improve the following deficits and impairments:  Decreased strength, Decreased activity tolerance, Impaired perceived functional ability, Pain, Increased muscle spasms  Visit Diagnosis: Pain in left hip - Plan: PT plan of care cert/re-cert  Muscle weakness (generalized) - Plan: PT plan of care cert/re-cert  Pain in thoracic spine - Plan: PT plan of care cert/re-cert     Problem List Patient Active Problem List   Diagnosis Date Noted  . Insomnia 10/20/2011  . RESTLESS LEGS SYNDROME 02/06/2009  . ALLERGIC RHINITIS 01/31/2009  . CONSTIPATION, RECURRENT 01/31/2009  . ELEVATED BLOOD PRESSURE WITHOUT DIAGNOSIS OF HYPERTENSION 01/31/2009    Jomarie Longs PT 06/11/2016,  10:19 PM  [facility] [address], [place], [address] Phone: [phone]   Fax:  [phone]  Name: Alejandra Ward MRN: [id number] Date of Birth: 1956/06/18

## 2016-06-12 ENCOUNTER — Ambulatory Visit: Payer: 59 | Admitting: Physical Therapy

## 2016-06-12 ENCOUNTER — Encounter: Payer: Self-pay | Admitting: Physical Therapy

## 2016-06-12 DIAGNOSIS — M25552 Pain in left hip: Secondary | ICD-10-CM

## 2016-06-12 DIAGNOSIS — M6281 Muscle weakness (generalized): Secondary | ICD-10-CM

## 2016-06-12 DIAGNOSIS — M546 Pain in thoracic spine: Secondary | ICD-10-CM

## 2016-06-12 NOTE — Therapy (Signed)
[facility] [address] [place], [address] Phone: [phone]   Fax:  [phone]  Physical Therapy Treatment  Patient Details  Name: Alejandra Ward MRN: [id number] Date of Birth: Apr 18, 1956 No Data Recorded  Encounter Date: 06/12/2016      PT End of Session - 06/12/16 1900    Visit Number 2   Number of Visits 12   Date for PT Re-Evaluation 07/23/16   PT Start Time [id number]   PT Stop Time 1900   PT Time Calculation (min) 46 min   Activity Tolerance Patient tolerated treatment well   Behavior During Therapy [facility] for tasks assessed/performed      Past Medical History:  Diagnosis Date  . Anxiety   . Chronic headaches   . Migraines     History reviewed. No pertinent surgical history.  There were no vitals filed for this visit.      Subjective Assessment - 06/12/16 1820    Subjective had massage today and feels less pain/spasms in upper back and left gluteal region.   Limitations Sitting;Other (comment)   Patient Stated Goals decrease pain and return to normal activities   Currently in Pain? Yes   Pain Score 4    Pain Location Hip   Pain Orientation Left   Pain Descriptors / Indicators Aching;Tightness;Sore   Pain Type Acute pain   Pain Onset More than a month ago   Pain Frequency Intermittent     Objective: Palpation: + tenderness spasms left gluteal muscles and along thoracic spine left side  Treatment:  Modalities:  Ultrasound pulsed 50% 1 MHz to left side gluteal region/SIJ with patient prone lying x 10 min. Goal: decrease pain, improve mobility Electrical stimulation to thoracic spine bilateral aspect with patient prone with moist heat to same x 20 min.goal; reduce spasms and pain Manual therapy: STM to thoracic spine paraspinal muscles bilaterally, left>right side, left gluteal, piriformis region with patient prone lying Therapeutic exercise: patient performed exercises with  guidance, verbal and tactile cues and demonstration of PT: Quadruped: stabilization cat/camel x 10 reps Prone lying ER hip isometrics hold 2 seconds x 10 reps  Patient response to treatment: Patient demonstrated good technique with exercises with improved core control with repetition and VC; improved soft tissue elasticity with decreased spasms by 30% in thoracic spine and gluteal muscles following treatment. Reported improved ability to walk with decreased pain in left side of back and left hip        PT Education - 06/12/16 1845    Education provided Yes   Education Details HEP for stabilization in quadruped cat and camel, prone ER hip isometrics    Person(s) Educated Patient   Methods Explanation;Demonstration;Verbal cues   Comprehension Verbalized understanding;Returned demonstration;Verbal cues required             PT Long Term Goals - 06/10/16 2000      PT LONG TERM GOAL #1   Title Pt will report 0/10 L hip/SIJ pain and upper back pain with rising from sitting and sitting in order to return to PLOF by 07/23/2016   Baseline pain level up to 5/10 with rising from sit, sitting,    Status New     PT LONG TERM GOAL #2   Title Pt will be independent with HEP for self-management of back and SIJ/hip by 07/23/2016   Baseline requires assistance, guidance for appropriate pain control strategies, exercises and progression   Status New  PT LONG TERM GOAL #3   Title Patient will demonstrate improved function with daily activites with mild self perceived disability as indicated by MODI of  20% or less by 07/23/2016   Baseline MODI = 34%   Status New               Plan - 06/12/16 1904    Clinical Impression Statement Patient continues with pain and inflammation left gluteal region, decreased spasms and pain in mid back and left hip with treatment   Rehab Potential Good   PT Frequency 2x / week   PT Duration 6 weeks   PT Treatment/Interventions Patient/family  education;Ultrasound;Moist Heat;Electrical Stimulation;Therapeutic exercise;Manual techniques;Cryotherapy   PT Next Visit Plan pain control, manual therapy STM, progress therapeutic exercises for core/hip strengthening/flexiblity   PT Home Exercise Plan pain control strategies, ROM, gentle stretching and begin stabilization      Patient will benefit from skilled therapeutic intervention in order to improve the following deficits and impairments:  Decreased strength, Decreased activity tolerance, Impaired perceived functional ability, Pain, Increased muscle spasms  Visit Diagnosis: Muscle weakness (generalized)  Pain in left hip  Pain in thoracic spine     Problem List Patient Active Problem List   Diagnosis Date Noted  . Insomnia 10/20/2011  . RESTLESS LEGS SYNDROME 02/06/2009  . ALLERGIC RHINITIS 01/31/2009  . CONSTIPATION, RECURRENT 01/31/2009  . ELEVATED BLOOD PRESSURE WITHOUT DIAGNOSIS OF HYPERTENSION 01/31/2009    Jomarie Longs PT 06/13/2016, 8:33 PM  [facility] [address], [place], [address] Phone: [phone]   Fax:  [phone]  Name: ALEYSSA PALOMAR MRN: [id number] Date of Birth: 14-Nov-1955

## 2016-06-16 ENCOUNTER — Encounter: Payer: Self-pay | Admitting: Physical Therapy

## 2016-06-16 ENCOUNTER — Ambulatory Visit: Payer: 59 | Admitting: Physical Therapy

## 2016-06-16 DIAGNOSIS — M25552 Pain in left hip: Secondary | ICD-10-CM

## 2016-06-16 DIAGNOSIS — M6281 Muscle weakness (generalized): Secondary | ICD-10-CM

## 2016-06-16 DIAGNOSIS — M546 Pain in thoracic spine: Secondary | ICD-10-CM

## 2016-06-16 NOTE — Therapy (Signed)
[facility] [address] [place], [address] Phone: [phone]   Fax:  [phone]  Physical Therapy Treatment  Patient Details  Name: Alejandra Ward MRN: [id number] Date of Birth: May 12, 1956 No Data Recorded  Encounter Date: 06/16/2016      PT End of Session - 06/16/16 1810    Visit Number 3   Number of Visits 12   Date for PT Re-Evaluation 07/23/16   PT Start Time 1801   PT Stop Time 1850   PT Time Calculation (min) 49 min   Activity Tolerance Patient tolerated treatment well   Behavior During Therapy [facility] for tasks assessed/performed      Past Medical History:  Diagnosis Date  . Anxiety   . Chronic headaches   . Migraines     History reviewed. No pertinent surgical history.  There were no vitals filed for this visit.      Subjective Assessment - 06/16/16 1801    Subjective Patient reports she had a weekend in which she had to go to a wedding and was outdoors in the rain. She sat for prolonged periods and this aggravated her pain. She is exercising as instructed and could not perform ER isometrics because of increased tenderness in left hip.   Limitations Sitting;Other (comment)   Patient Stated Goals decrease pain and return to normal activities   Currently in Pain? Yes   Pain Score 5    Pain Location Hip   Pain Orientation Left   Pain Descriptors / Indicators Aching;Sore   Pain Type Acute pain   Pain Onset More than a month ago   Pain Frequency Constant  since Saturday      Objective: Palpation: + spasms palpable left side thoracic spine paraspinal muscles and left SIJ/gluteal muscles  Treatment:  Modalities:  Ultrasound pulsed 50% 1 MHz to left side gluteal region/SIJ with patient prone lying x 7 min.; static 3MHz pulsed 20% 0.8 w/cm2 to trigger points/spasms  (3 min. Each TrP) left side thoracic spine/upper trapezius muscle:Goal: decrease pain, improve mobility Electrical  stimulation to thoracic spine and upper trapezius muscles bilaterally aspect with patient prone with moist heat to same x 20 min.goal; reduce spasms and pain Manual therapy: STM to thoracic spine paraspinal muscles bilaterally, left>right side, left gluteal, piriformis region with patient prone lying; goal: pain, spasms  Patient response to treatment: Patient reported mild to no pain at end of session, improved spasms to mild and decreased pain to mild, Improved gait pattern with decreased pain and stiffness in back and left hip reported by patient      PT Education - 06/16/16 1806    Education provided Yes   Education Details HEP: continue with core and hip flexibility and strengthening exercises   Person(s) Educated Patient   Methods Explanation;Demonstration;Verbal cues   Comprehension Verbalized understanding;Returned demonstration;Verbal cues required             PT Long Term Goals - 06/10/16 2000      PT LONG TERM GOAL #1   Title Pt will report 0/10 L hip/SIJ pain and upper back pain with rising from sitting and sitting in order to return to PLOF by 07/23/2016   Baseline pain level up to 5/10 with rising from sit, sitting,    Status New     PT LONG TERM GOAL #2   Title Pt will be independent with HEP for self-management of back and SIJ/hip by 07/23/2016   Baseline requires assistance,  guidance for appropriate pain control strategies, exercises and progression   Status New     PT LONG TERM GOAL #3   Title Patient will demonstrate improved function with daily activites with mild self perceived disability as indicated by MODI of  20% or less by 07/23/2016   Baseline MODI = 34%   Status New               Plan - 06/16/16 1850    Clinical Impression Statement Patient with decreased spasms and imrpoved soft tissue elasticity with treatment. She continues with pain in left hip and limited function with daily tasks and walking/sitting and will benefit from additional  physical therapy intervention to achieve goals.     Rehab Potential Good   PT Frequency 2x / week   PT Duration 6 weeks   PT Treatment/Interventions Patient/family education;Ultrasound;Moist Heat;Electrical Stimulation;Therapeutic exercise;Manual techniques;Cryotherapy   PT Next Visit Plan pain control, manual therapy STM, progress therapeutic exercises for core/hip strengthening/flexiblity   PT Home Exercise Plan pain control strategies, ROM, gentle stretching and begin stabilization      Patient will benefit from skilled therapeutic intervention in order to improve the following deficits and impairments:  Decreased strength, Decreased activity tolerance, Impaired perceived functional ability, Pain, Increased muscle spasms  Visit Diagnosis: Muscle weakness (generalized)  Pain in left hip  Pain in thoracic spine     Problem List Patient Active Problem List   Diagnosis Date Noted  . Insomnia 10/20/2011  . RESTLESS LEGS SYNDROME 02/06/2009  . ALLERGIC RHINITIS 01/31/2009  . CONSTIPATION, RECURRENT 01/31/2009  . ELEVATED BLOOD PRESSURE WITHOUT DIAGNOSIS OF HYPERTENSION 01/31/2009    Jomarie Longs PT 06/17/2016, 1:33 PM  [facility] [address], [place], [address] Phone: [phone]   Fax:  [phone]  Name: Alejandra Ward MRN: [id number] Date of Birth: 1956/01/27

## 2016-06-17 ENCOUNTER — Encounter: Payer: 59 | Admitting: Physical Therapy

## 2016-06-18 ENCOUNTER — Ambulatory Visit: Payer: 59 | Admitting: Physical Therapy

## 2016-06-18 ENCOUNTER — Encounter: Payer: Self-pay | Admitting: Physical Therapy

## 2016-06-18 DIAGNOSIS — M25552 Pain in left hip: Secondary | ICD-10-CM

## 2016-06-18 DIAGNOSIS — M546 Pain in thoracic spine: Secondary | ICD-10-CM

## 2016-06-18 DIAGNOSIS — M6281 Muscle weakness (generalized): Secondary | ICD-10-CM

## 2016-06-18 DIAGNOSIS — M62838 Other muscle spasm: Secondary | ICD-10-CM

## 2016-06-19 NOTE — Therapy (Signed)
[facility] [address] [place], [address] Phone: [phone]   Fax:  [phone]  Physical Therapy Treatment  Patient Details  Name: Alejandra Ward MRN: [id number] Date of Birth: June 28, 1956 No Data Recorded  Encounter Date: 06/18/2016      PT End of Session - 06/18/16 1900    Visit Number 4   Number of Visits 12   Date for PT Re-Evaluation 07/23/16   PT Start Time 1805   PT Stop Time 1852   PT Time Calculation (min) 47 min   Activity Tolerance Patient tolerated treatment well   Behavior During Therapy [facility] for tasks assessed/performed      Past Medical History:  Diagnosis Date  . Anxiety   . Chronic headaches   . Migraines     History reviewed. No pertinent surgical history.  There were no vitals filed for this visit.      Subjective Assessment - 06/18/16 1809    Subjective left hip much better and having spasms and pain in left upper back.   Limitations Sitting;House hold activities;Standing;Walking   Patient Stated Goals decrease pain and return to normal activities   Currently in Pain? Yes   Pain Score 4    Pain Location Back   Pain Orientation Left   Pain Descriptors / Indicators Aching;Spasm;Tightness   Pain Type Acute pain   Pain Onset More than a month ago   Pain Frequency Constant      Objective: Palpation: + spasms palpable left side thoracic spine paraspinal muscles and left SIJ/gluteal muscles  Treatment:  Modalities:  Ultrasound pulsed 50% 1 MHz to left side gluteal region/SIJ with patient prone lying x 10 min.; static 3MHz pulsed 20% 0.8 w/cm2 to trigger points/spasms  (3 min. Each TrP) left side thoracic spine/upper trapezius muscle:Goal: decrease pain, improve mobility  Electrical stimulation high volt, clinical protocol for muscle spasms,  to thoracic spine and upper trapezius muscles bilaterally aspect with patient prone with moist heat to same x 20 min.goal;  reduce spasms and pain  Manual therapy: STM to thoracic spine paraspinal muscles bilaterally, left>right side, left gluteal, piriformis region with patient prone lying; goal: pain, spasms  Patient response to treatment: Patient with decreased muscle spasms and tenderness to mild, no tenderness following treatment        PT Education - 06/18/16 1900    Education provided Yes   Education Details HEP: continue with exercises for core, posture, scapular retraction   Person(s) Educated Patient   Methods Explanation   Comprehension Verbalized understanding             PT Long Term Goals - 06/10/16 2000      PT LONG TERM GOAL #1   Title Pt will report 0/10 L hip/SIJ pain and upper back pain with rising from sitting and sitting in order to return to PLOF by 07/23/2016   Baseline pain level up to 5/10 with rising from sit, sitting,    Status New     PT LONG TERM GOAL #2   Title Pt will be independent with HEP for self-management of back and SIJ/hip by 07/23/2016   Baseline requires assistance, guidance for appropriate pain control strategies, exercises and progression   Status New     PT LONG TERM GOAL #3   Title Patient will demonstrate improved function with daily activites with mild self perceived disability as indicated by MODI of  20% or less by 07/23/2016   Baseline MODI =  34%   Status New               Plan - 06/18/16 1853    Clinical Impression Statement Patient with improved soft tissue elasticity with decreased spasms. she demonstrates good understanding of home program. She continues with spasms and pain in mid back and will benefit from additional physical therapy in order to improve function with minimal/no pain.    Rehab Potential Good   PT Frequency 2x / week   PT Duration 6 weeks   PT Treatment/Interventions Patient/family education;Ultrasound;Moist Heat;Electrical Stimulation;Therapeutic exercise;Manual techniques;Cryotherapy   PT Next Visit Plan pain  control, manual therapy STM, progress therapeutic exercises for core/hip strengthening/flexiblity   PT Home Exercise Plan pain control strategies, ROM, gentle stretching and begin stabilization      Patient will benefit from skilled therapeutic intervention in order to improve the following deficits and impairments:  Decreased strength, Decreased activity tolerance, Impaired perceived functional ability, Pain, Increased muscle spasms  Visit Diagnosis: Muscle weakness (generalized)  Pain in left hip  Pain in thoracic spine  Other muscle spasm     Problem List Patient Active Problem List   Diagnosis Date Noted  . Insomnia 10/20/2011  . RESTLESS LEGS SYNDROME 02/06/2009  . ALLERGIC RHINITIS 01/31/2009  . CONSTIPATION, RECURRENT 01/31/2009  . ELEVATED BLOOD PRESSURE WITHOUT DIAGNOSIS OF HYPERTENSION 01/31/2009    Jomarie Longs PT 06/19/2016, 10:32 PM  [facility] [address] [place], [address] Phone: [phone]   Fax:  [phone]  Name: Alejandra Ward MRN: [id number] Date of Birth: 1956-03-06

## 2016-06-23 ENCOUNTER — Encounter: Payer: 59 | Admitting: Physical Therapy

## 2016-06-25 ENCOUNTER — Encounter: Payer: 59 | Admitting: Physical Therapy

## 2016-06-30 ENCOUNTER — Ambulatory Visit: Payer: 59 | Admitting: Physical Therapy

## 2016-06-30 ENCOUNTER — Encounter: Payer: Self-pay | Admitting: Physical Therapy

## 2016-06-30 DIAGNOSIS — M546 Pain in thoracic spine: Secondary | ICD-10-CM

## 2016-06-30 DIAGNOSIS — M25552 Pain in left hip: Secondary | ICD-10-CM | POA: Diagnosis not present

## 2016-06-30 DIAGNOSIS — M6281 Muscle weakness (generalized): Secondary | ICD-10-CM

## 2016-06-30 DIAGNOSIS — M62838 Other muscle spasm: Secondary | ICD-10-CM

## 2016-06-30 NOTE — Therapy (Signed)
[facility] [address] [place], [address] Phone: [phone]   Fax:  [phone]  Physical Therapy Treatment  Patient Details  Name: Alejandra Ward MRN: [id number] Date of Birth: [birth year] No Data Recorded  Encounter Date: 06/30/2016      PT End of Session - 06/30/16 1655    Visit Number 5   Number of Visits 12   Date for PT Re-Evaluation 07/23/16   PT Start Time [id number]   PT Stop Time 1705   PT Time Calculation (min) 56 min   Activity Tolerance Patient tolerated treatment well   Behavior During Therapy [facility] for tasks assessed/performed      Past Medical History:  Diagnosis Date  . Anxiety   . Chronic headaches   . Migraines     History reviewed. No pertinent surgical history.  There were no vitals filed for this visit.      Subjective Assessment - 06/30/16 1610    Subjective left hip less painful and having spasms and pain in left upper back, about the same as previous session. Patient reports she is under some stress with preparing for daughter's wedding 07/12/16 and this may be contributing to her pain/spasms in neck/upper back.    Limitations Sitting;House hold activities;Standing;Walking   Patient Stated Goals decrease pain and return to normal activities   Currently in Pain? Yes   Pain Score 5    Pain Location Back   Pain Orientation Left;Upper;Lower   Pain Descriptors / Indicators Aching;Tightness;Spasm   Pain Type Acute pain   Pain Onset More than a month ago   Pain Frequency Constant        Objective: Palpation: + mild/moderate spasms palpable along medial border left scapula, lumbar paraspinals and left gluteal/piriformis muscles Posture; on arrival: mild forward head with rounded shoulders  Treatment:  Modalities:  Ultrasound pulsed 50% 1 MHz to left side thoracic spine/upper trapezius muscle with patient prone lying x 41min.;  :Goal: decrease pain, improve  mobility Electrical stimulation high volt, clinical protocol for muscle spasms,  to thoracic spine and upper trapezius musclesbilaterallyaspect with patient prone with moist heat to same x 20 min.goal; reduce spasms and pain  Manual therapy: STM to thoracic spine paraspinal muscles bilaterally, left>right side, left gluteal, piriformis region with patient prone lying; goal: pain, spasms  Patient response to treatment: patient demonstrated good understanding of home exercises  Patient with decreased pain from   5/10 to  3/10. Patient with decreased spasms by 50% following STM and modalities         PT Education - 06/30/16 1642    Education provided Yes   Education Details HEP: re assessed exercises to perform for posture, strengthening upper back   Person(s) Educated Patient   Methods Explanation   Comprehension Verbalized understanding             PT Long Term Goals - 06/10/16 2000      PT LONG TERM GOAL #1   Title Pt will report 0/10 L hip/SIJ pain and upper back pain with rising from sitting and sitting in order to return to PLOF by 07/23/2016   Baseline pain level up to 5/10 with rising from sit, sitting,    Status New     PT LONG TERM GOAL #2   Title Pt will be independent with HEP for self-management of back and SIJ/hip by 07/23/2016   Baseline requires assistance, guidance for appropriate pain control strategies, exercises and progression  Status New     PT LONG TERM GOAL #3   Title Patient will demonstrate improved function with daily activites with mild self perceived disability as indicated by MODI of  20% or less by 07/23/2016   Baseline MODI = 34%   Status New               Plan - 06/30/16 1655    Clinical Impression Statement Patient with decreased spasms in mid back and left SIJ/gluteal region. She is progressing with independent exercises. Patient may be progressing more slowly due to underlying stress of preparing for daughter's wedding next  week.    Rehab Potential Good   PT Frequency 2x / week   PT Duration 6 weeks   PT Treatment/Interventions Patient/family education;Ultrasound;Moist Heat;Electrical Stimulation;Therapeutic exercise;Manual techniques;Cryotherapy   PT Next Visit Plan pain control, manual therapy STM, progress therapeutic exercises for core/hip strengthening/flexiblity   PT Home Exercise Plan pain control strategies, ROM, gentle stretching and begin stabilization      Patient will benefit from skilled therapeutic intervention in order to improve the following deficits and impairments:  Decreased strength, Decreased activity tolerance, Impaired perceived functional ability, Pain, Increased muscle spasms  Visit Diagnosis: Muscle weakness (generalized)  Pain in thoracic spine  Other muscle spasm  Hip pain, acute, left     Problem List Patient Active Problem List   Diagnosis Date Noted  . Insomnia 10/20/2011  . RESTLESS LEGS SYNDROME 02/06/2009  . ALLERGIC RHINITIS 01/31/2009  . CONSTIPATION, RECURRENT 01/31/2009  . ELEVATED BLOOD PRESSURE WITHOUT DIAGNOSIS OF HYPERTENSION 01/31/2009    Jomarie Longs PT 07/01/2016, 10:13 AM  [facility] [address], [place], [address] Phone: [phone]   Fax:  [phone]  Name: Alejandra Ward MRN: [id number] Date of Birth: August 01, 1956

## 2016-07-02 ENCOUNTER — Encounter: Payer: 59 | Admitting: Physical Therapy

## 2016-07-02 ENCOUNTER — Encounter: Payer: Self-pay | Admitting: Physical Therapy

## 2016-07-02 ENCOUNTER — Ambulatory Visit: Payer: 59 | Admitting: Physical Therapy

## 2016-07-02 DIAGNOSIS — M25552 Pain in left hip: Secondary | ICD-10-CM | POA: Diagnosis not present

## 2016-07-02 DIAGNOSIS — M62838 Other muscle spasm: Secondary | ICD-10-CM

## 2016-07-02 DIAGNOSIS — M546 Pain in thoracic spine: Secondary | ICD-10-CM

## 2016-07-02 DIAGNOSIS — M6281 Muscle weakness (generalized): Secondary | ICD-10-CM

## 2016-07-03 NOTE — Therapy (Signed)
[facility] [address], [place], [address] Phone: [phone]   Fax:  [phone]  Physical Therapy Treatment  Patient Details  Name: Alejandra Ward MRN: [id number] Date of Birth: 1956/07/20 No Data Recorded  Encounter Date: 07/02/2016      PT End of Session - 07/02/16 1830    Visit Number 6   Number of Visits 12   Date for PT Re-Evaluation 07/23/16   PT Start Time [id number]   PT Stop Time 1838   PT Time Calculation (min) 56 min   Activity Tolerance Patient tolerated treatment well   Behavior During Therapy [facility] for tasks assessed/performed      Past Medical History:  Diagnosis Date  . Anxiety   . Chronic headaches   . Migraines     History reviewed. No pertinent surgical history.  There were no vitals filed for this visit.      Subjective Assessment - 07/02/16 1743    Subjective left hip less painful and having spasms and pain in left upper back, about the same as previous session.   Limitations Sitting;House hold activities;Standing;Walking   Patient Stated Goals decrease pain and return to normal activities   Currently in Pain? Yes   Pain Score 5    Pain Location Back   Pain Orientation Left;Upper;Mid   Pain Descriptors / Indicators Aching;Tightness;Spasm   Pain Type Acute pain   Pain Onset More than a month ago   Pain Frequency Constant      Objective: Palpation: + mild/moderate spasms palpable along thoracic spine, upper trapezius and medial border left scapula Posture; on arrival: mild forward head with rounded shoulders  Treatment:  Modalities:  Ultrasound pulsed 50% 1 MHz to left side thoracic spine/upper trapezius muscle with patient prone lying x 82min.;  :Goal: decrease pain, improve mobility Electrical stimulation high volt, clinical protocol for muscle spasms, to thoracic spine and upper trapezius musclesbilaterallyaspect with patient prone with moist heat to same x  20 min.goal; reduce spasms and pain  Manual therapy: STM to thoracic spine paraspinal muscles bilaterally, left>right side, left gluteal, piriformis region with patient prone lying; goal: pain, spasms  Patient response to treatment: Patient with decreased pain from   5/10 to  2/10. Patient with decreased spasms by at least 50% following STM. Verbalized good understanding of home exercises        PT Education - 07/02/16 1829    Education provided Yes   Education Details HEP: continue as instructed for strengthening and core stability, walk    Person(s) Educated Patient   Methods Explanation   Comprehension Verbalized understanding             PT Long Term Goals - 06/10/16 2000      PT LONG TERM GOAL #1   Title Pt will report 0/10 L hip/SIJ pain and upper back pain with rising from sitting and sitting in order to return to PLOF by 07/23/2016   Baseline pain level up to 5/10 with rising from sit, sitting,    Status New     PT LONG TERM GOAL #2   Title Pt will be independent with HEP for self-management of back and SIJ/hip by 07/23/2016   Baseline requires assistance, guidance for appropriate pain control strategies, exercises and progression   Status New     PT LONG TERM GOAL #3   Title Patient will demonstrate improved function with daily activites with mild self perceived disability as indicated by [facility]  of  20% or less by 07/23/2016   Baseline MODI = 34%   Status New               Plan - 07/02/16 1830    Clinical Impression Statement Patient with improving left hip pain with only soreness, mild. Continues with increased pain/spasms in left upper and mid back region due to stress of preparing for daughter's upcoming wedding next week. She should continue to improve with physical therapy intervention with good carry over between sessions noted.    Rehab Potential Good   PT Frequency 2x / week   PT Duration 6 weeks   PT Treatment/Interventions Patient/family  education;Ultrasound;Moist Heat;Electrical Stimulation;Therapeutic exercise;Manual techniques;Cryotherapy   PT Next Visit Plan pain control, manual therapy STM, progress therapeutic exercises for core/hip strengthening/flexiblity   PT Home Exercise Plan pain control strategies, ROM, gentle stretching and begin stabilization      Patient will benefit from skilled therapeutic intervention in order to improve the following deficits and impairments:  Decreased strength, Decreased activity tolerance, Impaired perceived functional ability, Pain, Increased muscle spasms  Visit Diagnosis: Muscle weakness (generalized)  Pain in thoracic spine  Other muscle spasm     Problem List Patient Active Problem List   Diagnosis Date Noted  . Insomnia 10/20/2011  . RESTLESS LEGS SYNDROME 02/06/2009  . ALLERGIC RHINITIS 01/31/2009  . CONSTIPATION, RECURRENT 01/31/2009  . ELEVATED BLOOD PRESSURE WITHOUT DIAGNOSIS OF HYPERTENSION 01/31/2009    Jomarie Longs PT 07/03/2016, 8:46 PM  [facility] [address], [place], [address] Phone: [phone]   Fax:  [phone]  Name: Alejandra Ward MRN: [id number] Date of Birth: 06-04-1956

## 2016-07-07 ENCOUNTER — Ambulatory Visit: Payer: 59 | Admitting: Physical Therapy

## 2016-07-07 ENCOUNTER — Encounter: Payer: Self-pay | Admitting: Physical Therapy

## 2016-07-07 DIAGNOSIS — M546 Pain in thoracic spine: Secondary | ICD-10-CM

## 2016-07-07 DIAGNOSIS — M25552 Pain in left hip: Secondary | ICD-10-CM

## 2016-07-07 DIAGNOSIS — M62838 Other muscle spasm: Secondary | ICD-10-CM

## 2016-07-08 NOTE — Therapy (Signed)
[facility] [address] [place], [address] Phone: [phone]   Fax:  [phone]  Physical Therapy Treatment  Patient Details  Name: Alejandra Ward MRN: [id number] Date of Birth: 03-20-1956 No Data Recorded  Encounter Date: 07/07/2016      PT End of Session - 07/07/16 1945    Visit Number 7   Number of Visits 12   Date for PT Re-Evaluation 07/23/16   PT Start Time 1843   PT Stop Time 1940   PT Time Calculation (min) 57 min   Activity Tolerance Patient tolerated treatment well   Behavior During Therapy [facility] for tasks assessed/performed      Past Medical History:  Diagnosis Date  . Anxiety   . Chronic headaches   . Migraines     History reviewed. No pertinent surgical history.  There were no vitals filed for this visit.      Subjective Assessment - 07/07/16 1844    Subjective Left hip is much less painful and decreased spasms. She has increased spasms in upper back.    Limitations Sitting;House hold activities;Standing;Walking   Patient Stated Goals decrease pain and return to normal activities   Currently in Pain? Yes   Pain Score 4    Pain Location Back   Pain Orientation Left;Upper;Mid   Pain Descriptors / Indicators Aching;Tightness;Spasm   Pain Type Acute pain   Pain Onset More than a month ago      Objective: Palpation: Moderate spasms along thoracic spine and upper trapezius left Posture; on arrival: mild forward head with rounded shoulders  Treatment:  Modalities:  Ultrasound pulsed 50% 1 MHz to left side thoracic spine/upper trapezius muscle with patient prone lying x 73min.; :Goal: decrease pain, improve mobility Electrical stimulation high volt, clinical protocol for muscle spasms, to thoracic spine and upper trapezius musclesbilaterallyaspect with patient prone with moist heat to same x 20 min.goal; reduce spasms and pain  Manual therapy:  STM to thoracic  spine paraspinal muscles bilaterally,superficial and deep techniques left>right side,  with patient prone lying; goal: pain, spasms  Patient response to treatment: Patient with decreased pain from   4/10 to  1/10. Patient with decreased spasms by 50% following STM. Patient reported feeling much better and relaxed following treatment         PT Education - 07/07/16 1910    Education provided Yes   Education Details HEP: use of heat, massage and exercise to decrease spasms, continue with exercises as instructed   Person(s) Educated Patient   Methods Explanation   Comprehension Verbalized understanding             PT Long Term Goals - 06/10/16 2000      PT LONG TERM GOAL #1   Title Pt will report 0/10 L hip/SIJ pain and upper back pain with rising from sitting and sitting in order to return to PLOF by 07/23/2016   Baseline pain level up to 5/10 with rising from sit, sitting,    Status New     PT LONG TERM GOAL #2   Title Pt will be independent with HEP for self-management of back and SIJ/hip by 07/23/2016   Baseline requires assistance, guidance for appropriate pain control strategies, exercises and progression   Status New     PT LONG TERM GOAL #3   Title Patient will demonstrate improved function with daily activites with mild self perceived disability as indicated by MODI of  20% or less by  07/23/2016   Baseline MODI = 34%   Status New               Plan - 07/07/16 2014    Clinical Impression Statement Patient demonstrates good carry over between visits as indicated by no left hip pain today. She is responding well to current treatment with STM and modalities to control pain and spasms as she contiues to improve from acute episode of back pain.    Rehab Potential Good   PT Frequency 2x / week   PT Duration 6 weeks   PT Treatment/Interventions Patient/family education;Ultrasound;Moist Heat;Electrical Stimulation;Therapeutic exercise;Manual techniques;Cryotherapy    PT Next Visit Plan pain control, manual therapy STM, progress therapeutic exercises for core/hip strengthening/flexiblity   PT Home Exercise Plan pain control strategies, ROM, gentle stretching and begin stabilization      Patient will benefit from skilled therapeutic intervention in order to improve the following deficits and impairments:  Decreased strength, Decreased activity tolerance, Impaired perceived functional ability, Pain, Increased muscle spasms  Visit Diagnosis: Pain in thoracic spine  Other muscle spasm  Hip pain, acute, left     Problem List Patient Active Problem List   Diagnosis Date Noted  . Insomnia 10/20/2011  . RESTLESS LEGS SYNDROME 02/06/2009  . ALLERGIC RHINITIS 01/31/2009  . CONSTIPATION, RECURRENT 01/31/2009  . ELEVATED BLOOD PRESSURE WITHOUT DIAGNOSIS OF HYPERTENSION 01/31/2009    Jomarie Longs PT 07/08/2016, 8:15 PM  [facility] [address], [place], [address] Phone: [phone]   Fax:  [phone]  Name: Alejandra Ward MRN: [id number] Date of Birth: Aug 20, 1956

## 2016-07-10 ENCOUNTER — Ambulatory Visit: Payer: 59 | Admitting: Physical Therapy

## 2016-07-14 ENCOUNTER — Ambulatory Visit: Payer: 59 | Admitting: Physical Therapy

## 2016-07-16 ENCOUNTER — Encounter: Payer: Self-pay | Admitting: Physical Therapy

## 2016-07-16 ENCOUNTER — Ambulatory Visit: Payer: 59 | Attending: Orthopedic Surgery | Admitting: Physical Therapy

## 2016-07-16 DIAGNOSIS — M62838 Other muscle spasm: Secondary | ICD-10-CM

## 2016-07-16 DIAGNOSIS — M546 Pain in thoracic spine: Secondary | ICD-10-CM | POA: Diagnosis not present

## 2016-07-17 NOTE — Therapy (Signed)
[place] [facility] [address] [place], [address] Phone: [phone]   Fax:  [phone]  Physical Therapy Treatment  Patient Details  Name: Alejandra Ward MRN: [id number] Date of Birth: 12/19/1955 No Data Recorded  Encounter Date: 07/16/2016      PT End of Session - 07/16/16 1754    Visit Number 8   Number of Visits 12   Date for PT Re-Evaluation 07/23/16   PT Start Time 1749   PT Stop Time 1830   PT Time Calculation (min) 41 min   Activity Tolerance Patient tolerated treatment well   Behavior During Therapy [facility] for tasks assessed/performed      Past Medical History:  Diagnosis Date  . Anxiety   . Chronic headaches   . Migraines     History reviewed. No pertinent surgical history.  There were no vitals filed for this visit.      Subjective Assessment - 07/16/16 1754    Subjective Patient reports she has had a massage today and should feel better in her upper back muscles. She still feels tight on left side of back and left hip is much improved.    Limitations Sitting;House hold activities;Standing;Walking   Patient Stated Goals decrease pain and return to normal activities   Currently in Pain? Yes   Pain Score 4    Pain Location Back   Pain Orientation Left;Mid;Upper   Pain Descriptors / Indicators Aching;Tightness;Spasm   Pain Type Acute pain   Pain Onset More than a month ago   Pain Frequency Intermittent      Objective: Palpation: thoracic spine: left side mid back with moderate spasms  AROM: lumbar spine minimal decreased flexion, extension   Treatment:  Modalities:  Electrical stimulation high volt, clinical protocol for muscle spasms, to thoracic spine and lumbar paraspinal musclesbilaterally with patient prone with moist heat applied to same x 20 min.goal; reduce spasms and pain  Manual therapy:  STM to thoracic spine paraspinal muscles bilaterally,superficial and deep  techniques left>right side,  with patient prone lying; goal: pain, spasms  Patient response to treatment: Patient pain decreased from 4/10 to 1/10 and decreased spasms by 50% with STM and modalities.          PT Education - 07/16/16 1800    Education provided Yes   Education Details HEP: continues with stabilization, scapular retraction, use of heat    Person(s) Educated Patient   Methods Explanation   Comprehension Verbalized understanding             PT Long Term Goals - 06/10/16 2000      PT LONG TERM GOAL #1   Title Pt will report 0/10 L hip/SIJ pain and upper back pain with rising from sitting and sitting in order to return to PLOF by 07/23/2016   Baseline pain level up to 5/10 with rising from sit, sitting,    Status New     PT LONG TERM GOAL #2   Title Pt will be independent with HEP for self-management of back and SIJ/hip by 07/23/2016   Baseline requires assistance, guidance for appropriate pain control strategies, exercises and progression   Status New     PT LONG TERM GOAL #3   Title Patient will demonstrate improved function with daily activites with mild self perceived disability as indicated by MODI of  20% or less by 07/23/2016   Baseline MODI = 34%   Status New  Plan - 07/16/16 1831    Clinical Impression Statement Patient demonstrates good carry over between sessions with decreasing spasms and pain in mid back and left hip. She will benefit from continued physical therapy to address spasms, pain in bacck and progress exercises to achieve maximal function and allow transition to independent home program.    Rehab Potential Good   PT Frequency 2x / week   PT Duration 6 weeks   PT Treatment/Interventions Patient/family education;Ultrasound;Moist Heat;Electrical Stimulation;Therapeutic exercise;Manual techniques;Cryotherapy   PT Next Visit Plan pain control, manual therapy STM, progress therapeutic exercises for core/hip  strengthening/flexiblity   PT Home Exercise Plan pain control strategies, ROM, gentle stretching and begin stabilization      Patient will benefit from skilled therapeutic intervention in order to improve the following deficits and impairments:  Decreased strength, Decreased activity tolerance, Impaired perceived functional ability, Pain, Increased muscle spasms  Visit Diagnosis: Pain in thoracic spine  Other muscle spasm     Problem List Patient Active Problem List   Diagnosis Date Noted  . Insomnia 10/20/2011  . RESTLESS LEGS SYNDROME 02/06/2009  . ALLERGIC RHINITIS 01/31/2009  . CONSTIPATION, RECURRENT 01/31/2009  . ELEVATED BLOOD PRESSURE WITHOUT DIAGNOSIS OF HYPERTENSION 01/31/2009    Jomarie Longs PT 07/17/2016, 8:56 PM  [place] [facility] [address], [place], [address] Phone: [phone]   Fax:  [phone]  Name: Alejandra Ward MRN: [id number] Date of Birth: 23-Jul-1956

## 2016-07-23 ENCOUNTER — Ambulatory Visit: Payer: 59 | Admitting: Physical Therapy

## 2016-07-23 DIAGNOSIS — M546 Pain in thoracic spine: Secondary | ICD-10-CM

## 2016-07-23 DIAGNOSIS — M62838 Other muscle spasm: Secondary | ICD-10-CM

## 2016-07-23 NOTE — Therapy (Signed)
[place] [facility] [address] [place], [address] Phone: [phone]   Fax:  [phone]  Physical Therapy Treatment/Discharge Summary  Patient Details  Name: Alejandra Ward MRN: [id number] Date of Birth: 1956-04-15 No Data Recorded  Encounter Date: 07/23/2016   Patient began physical therapy 06/10/2016 and attended 9 session through 07/23/16 and achieved all goals and is ready for discharge to independent home program.       PT End of Session - 07/23/16 1935    Visit Number 9   Number of Visits 12   Date for PT Re-Evaluation 07/23/16   PT Start Time 1829   PT Stop Time 1915   PT Time Calculation (min) 46 min   Activity Tolerance Patient tolerated treatment well   Behavior During Therapy [facility] for tasks assessed/performed      Past Medical History:  Diagnosis Date  . Anxiety   . Chronic headaches   . Migraines     No past surgical history on file.  There were no vitals filed for this visit.      Subjective Assessment - 07/23/16 1915    Subjective Patient reports no pain in left hip any longer and upper back is still tight and in spasms. She has less than 20% self perceived impairment and agrees to discharge form physical therapy to independent with home program.    Limitations Sitting;House hold activities;Standing;Walking   Patient Stated Goals decrease pain and return to normal activities   Currently in Pain? Yes   Pain Score 2    Pain Location Back   Pain Orientation Left;Mid;Upper   Pain Descriptors / Indicators Aching;Spasm   Pain Type Acute pain   Pain Onset More than a month ago   Pain Frequency Intermittent      Objective: Palpation: thoracic spine: left side mid back with mild/moderate spasms; no spasms or point tenderness noted left hip/SIJ region  AROM: lumbar spine minimal decreased flexion, extension; left hip WNL Outcome measure: MODI 18% self perceived  impairment   Treatment:  Modalities:  Electrical stimulation high volt, clinical protocol for muscle spasms, to thoracic spine and lumbar paraspinal musclesbilaterally with patient prone with moist heat applied to same x 20 min.goal; reduce spasms and pain Ultrasound 1MHz 50% pulsed 1.3w/cm2 to left thoracic spine paraspinal muscles with patient prone lying  Manual therapy:  STM to thoracic spine paraspinal muscles bilaterally,superficial and deep techniquesleft>right side, with patient prone lying; goal: pain, spasms  Re assessed home program of exercises for stabilization, hip ER, IR, adduction with glute sets  Patient response to treatment: Patient demonstrated decreased spasms by >50% following [place] and manual therapy. She verbalized good understanding of home program.          PT Education - 07/23/16 1935    Education provided Yes   Education Details HEP: re assessed home program of exercises   Person(s) Educated Patient   Methods Explanation   Comprehension Verbalized understanding             PT Long Term Goals - 07/23/16 1930      PT LONG TERM GOAL #1   Title Pt will report 0/10 L hip/SIJ pain and upper back pain with rising from sitting and sitting in order to return to PLOF by 07/23/2016   Baseline pain level up to 5/10 with rising from sit, sitting,    Status Achieved     PT LONG TERM GOAL #2   Title Pt will be  independent with HEP for self-management of back and SIJ/hip by 07/23/2016   Baseline requires assistance, guidance for appropriate pain control strategies, exercises and progression   Status Achieved     PT LONG TERM GOAL #3   Title Patient will demonstrate improved function with daily activites with mild self perceived disability as indicated by MODI of  20% or less by 07/23/2016   Baseline MODI = 34%   Status Achieved               Plan - 07/23/16 1934    Clinical Impression Statement Patient has achieved all goals and is ready  for discharge from physical therapy at this time. She has MODI <20%, good understanding of home program and no further problems with left hip.    Rehab Potential Good   PT Frequency 2x / week   PT Duration 6 weeks   PT Treatment/Interventions Patient/family education;Ultrasound;Moist Heat;Electrical Stimulation;Therapeutic exercise;Manual techniques;Cryotherapy   PT Next Visit Plan discharge from physical therapy   PT Home Exercise Plan pain control strategies, ROM, gentle stretching and begin stabilization      Patient will benefit from skilled therapeutic intervention in order to improve the following deficits and impairments:  Decreased strength, Decreased activity tolerance, Impaired perceived functional ability, Pain, Increased muscle spasms  Visit Diagnosis: Pain in thoracic spine  Other muscle spasm     Problem List Patient Active Problem List   Diagnosis Date Noted  . Insomnia 10/20/2011  . RESTLESS LEGS SYNDROME 02/06/2009  . ALLERGIC RHINITIS 01/31/2009  . CONSTIPATION, RECURRENT 01/31/2009  . ELEVATED BLOOD PRESSURE WITHOUT DIAGNOSIS OF HYPERTENSION 01/31/2009    Jomarie Longs PT 07/24/2016, 11:49 PM  [facility] [address], [place], [address] Phone: [phone]   Fax:  [phone]  Name: Alejandra Ward MRN: [id number] Date of Birth: April 15, 1956

## 2016-07-29 ENCOUNTER — Encounter: Payer: 59 | Admitting: Physical Therapy

## 2016-08-05 ENCOUNTER — Encounter: Payer: 59 | Admitting: Physical Therapy

## 2016-12-02 DIAGNOSIS — Z01419 Encounter for gynecological examination (general) (routine) without abnormal findings: Secondary | ICD-10-CM | POA: Diagnosis not present

## 2016-12-02 DIAGNOSIS — K219 Gastro-esophageal reflux disease without esophagitis: Secondary | ICD-10-CM | POA: Diagnosis not present

## 2016-12-04 DIAGNOSIS — R197 Diarrhea, unspecified: Secondary | ICD-10-CM | POA: Diagnosis not present

## 2016-12-15 DIAGNOSIS — L821 Other seborrheic keratosis: Secondary | ICD-10-CM | POA: Diagnosis not present

## 2016-12-15 DIAGNOSIS — D485 Neoplasm of uncertain behavior of skin: Secondary | ICD-10-CM | POA: Diagnosis not present

## 2016-12-15 DIAGNOSIS — L570 Actinic keratosis: Secondary | ICD-10-CM | POA: Diagnosis not present

## 2016-12-15 DIAGNOSIS — L578 Other skin changes due to chronic exposure to nonionizing radiation: Secondary | ICD-10-CM | POA: Diagnosis not present

## 2017-03-12 DIAGNOSIS — K580 Irritable bowel syndrome with diarrhea: Secondary | ICD-10-CM | POA: Diagnosis not present

## 2017-03-12 DIAGNOSIS — R1084 Generalized abdominal pain: Secondary | ICD-10-CM | POA: Diagnosis not present

## 2017-03-13 DIAGNOSIS — R197 Diarrhea, unspecified: Secondary | ICD-10-CM | POA: Diagnosis not present

## 2017-03-24 ENCOUNTER — Other Ambulatory Visit: Payer: Self-pay | Admitting: Internal Medicine

## 2017-03-24 DIAGNOSIS — R1011 Right upper quadrant pain: Secondary | ICD-10-CM

## 2017-03-30 ENCOUNTER — Ambulatory Visit
Admission: RE | Admit: 2017-03-30 | Discharge: 2017-03-30 | Disposition: A | Discharge status: Home / Self Care | Payer: 59 | Source: Ambulatory Visit | Attending: Internal Medicine | Admitting: Internal Medicine

## 2017-03-30 DIAGNOSIS — Z9049 Acquired absence of other specified parts of digestive tract: Secondary | ICD-10-CM | POA: Insufficient documentation

## 2017-03-30 DIAGNOSIS — R1011 Right upper quadrant pain: Secondary | ICD-10-CM | POA: Diagnosis not present

## 2017-03-31 DIAGNOSIS — N63 Unspecified lump in unspecified breast: Secondary | ICD-10-CM | POA: Diagnosis not present

## 2017-04-02 DIAGNOSIS — N63 Unspecified lump in unspecified breast: Secondary | ICD-10-CM | POA: Diagnosis not present

## 2017-04-02 DIAGNOSIS — R1010 Upper abdominal pain, unspecified: Secondary | ICD-10-CM | POA: Diagnosis not present

## 2017-04-02 DIAGNOSIS — N644 Mastodynia: Secondary | ICD-10-CM | POA: Diagnosis not present

## 2017-04-02 DIAGNOSIS — Z9049 Acquired absence of other specified parts of digestive tract: Secondary | ICD-10-CM | POA: Diagnosis not present

## 2017-04-02 DIAGNOSIS — N6322 Unspecified lump in the left breast, upper inner quadrant: Secondary | ICD-10-CM | POA: Diagnosis not present

## 2017-04-02 DIAGNOSIS — R197 Diarrhea, unspecified: Secondary | ICD-10-CM | POA: Diagnosis not present

## 2017-04-03 ENCOUNTER — Other Ambulatory Visit: Payer: Self-pay | Admitting: Internal Medicine

## 2017-04-03 DIAGNOSIS — R1010 Upper abdominal pain, unspecified: Secondary | ICD-10-CM

## 2017-04-10 ENCOUNTER — Ambulatory Visit
Admission: RE | Admit: 2017-04-10 | Discharge: 2017-04-10 | Disposition: A | Discharge status: Home / Self Care | Payer: 59 | Source: Ambulatory Visit | Attending: Internal Medicine | Admitting: Internal Medicine

## 2017-04-10 ENCOUNTER — Encounter: Payer: Self-pay | Admitting: Radiology

## 2017-04-10 DIAGNOSIS — R109 Unspecified abdominal pain: Secondary | ICD-10-CM | POA: Diagnosis not present

## 2017-04-10 DIAGNOSIS — R1010 Upper abdominal pain, unspecified: Secondary | ICD-10-CM

## 2017-04-10 MED ORDER — GADOBENATE DIMEGLUMINE 529 MG/ML IV SOLN
15.0000 mL | Freq: Once | INTRAVENOUS | Status: AC | PRN
  Administered 2017-04-10: 13 mL via INTRAVENOUS

## 2017-06-01 DIAGNOSIS — N63 Unspecified lump in unspecified breast: Secondary | ICD-10-CM | POA: Diagnosis not present

## 2017-06-01 DIAGNOSIS — N6310 Unspecified lump in the right breast, unspecified quadrant: Secondary | ICD-10-CM | POA: Diagnosis not present

## 2017-06-01 DIAGNOSIS — K219 Gastro-esophageal reflux disease without esophagitis: Secondary | ICD-10-CM | POA: Diagnosis not present

## 2017-06-01 DIAGNOSIS — K589 Irritable bowel syndrome without diarrhea: Secondary | ICD-10-CM | POA: Diagnosis not present

## 2017-06-01 DIAGNOSIS — N6311 Unspecified lump in the right breast, upper outer quadrant: Secondary | ICD-10-CM | POA: Diagnosis not present

## 2017-06-17 DIAGNOSIS — Z23 Encounter for immunization: Secondary | ICD-10-CM | POA: Diagnosis not present

## 2017-07-15 ENCOUNTER — Emergency Department
Admission: EM | Admit: 2017-07-15 | Discharge: 2017-07-15 | Disposition: A | Discharge status: Home / Self Care | Payer: 59 | Attending: Emergency Medicine | Admitting: Emergency Medicine

## 2017-07-15 ENCOUNTER — Encounter: Payer: Self-pay | Admitting: Emergency Medicine

## 2017-07-15 DIAGNOSIS — Z79899 Other long term (current) drug therapy: Secondary | ICD-10-CM | POA: Insufficient documentation

## 2017-07-15 DIAGNOSIS — R002 Palpitations: Secondary | ICD-10-CM | POA: Diagnosis not present

## 2017-07-15 DIAGNOSIS — Z91040 Latex allergy status: Secondary | ICD-10-CM | POA: Diagnosis not present

## 2017-07-15 LAB — CBC
HCT: 42.1 % (ref 35.0–47.0)
HEMOGLOBIN: 14.8 g/dL (ref 12.0–16.0)
MCH: 31.9 pg (ref 26.0–34.0)
MCHC: 35.1 g/dL (ref 32.0–36.0)
MCV: 91 fL (ref 80.0–100.0)
Platelets: 246 10*3/uL (ref 150–440)
RBC: 4.63 MIL/uL (ref 3.80–5.20)
RDW: 12.9 % (ref 11.5–14.5)
WBC: 6.7 10*3/uL (ref 3.6–11.0)

## 2017-07-15 LAB — COMPREHENSIVE METABOLIC PANEL WITH GFR
ALT: 18 U/L (ref 14–54)
AST: 21 U/L (ref 15–41)
Albumin: 4.5 g/dL (ref 3.5–5.0)
Alkaline Phosphatase: 64 U/L (ref 38–126)
Anion gap: 11 (ref 5–15)
BUN: 17 mg/dL (ref 6–20)
CO2: 23 mmol/L (ref 22–32)
Calcium: 9.5 mg/dL (ref 8.9–10.3)
Chloride: 103 mmol/L (ref 101–111)
Creatinine, Ser: 0.61 mg/dL (ref 0.44–1.00)
GFR calc Af Amer: >60 mL/min
GFR calc non Af Amer: >60 mL/min
Glucose, Bld: 96 mg/dL (ref 65–99)
Potassium: 3.2 mmol/L — ABNORMAL LOW (ref 3.5–5.1)
Sodium: 137 mmol/L (ref 135–145)
Total Bilirubin: 1.6 mg/dL — ABNORMAL HIGH (ref 0.3–1.2)
Total Protein: 7.9 g/dL (ref 6.5–8.1)

## 2017-07-15 LAB — TROPONIN I: Troponin I: <0.03 ng/mL

## 2017-07-15 LAB — MAGNESIUM: MAGNESIUM: 2.1 mg/dL (ref 1.7–2.4)

## 2017-07-15 LAB — TSH: TSH: 3.302 u[IU]/mL (ref 0.350–4.500)

## 2017-07-15 MED ORDER — POTASSIUM CHLORIDE CRYS ER 20 MEQ PO TBCR
40.0000 meq | EXTENDED_RELEASE_TABLET | Freq: Once | ORAL | Status: AC
  Administered 2017-07-15: 40 meq via ORAL

## 2017-07-15 MED ORDER — POTASSIUM CHLORIDE CRYS ER 10 MEQ PO TBCR: 10.0000 meq | EXTENDED_RELEASE_TABLET | Freq: Two times a day (BID) | ORAL | 0 refills | Status: DC

## 2017-07-15 MED ORDER — POTASSIUM CHLORIDE CRYS ER 20 MEQ PO TBCR
EXTENDED_RELEASE_TABLET | ORAL | Status: AC
  Administered 2017-07-15: 40 meq via ORAL
  Filled 2017-07-15: qty 2

## 2017-07-15 NOTE — ED Provider Notes (Signed)
[facility] Emergency Department Provider Note  ____________________________________________  Time seen: Approximately 9:06 PM  I have reviewed the triage vital signs and the nursing notes.   HISTORY  Chief Complaint Palpitations   HPI Alejandra Ward is a 61 y.o. female with a history of anxiety and migraine headaches who presents for evaluation of palpitations. Patient reports that her symptoms have been ongoing throughout most of the day today. She woke up feeling palpitations and once in a while she'll feel a skipped beat. When she feels a skipped beat she reports a fullness in her chest that lasts a split of the second and then resolves. She reports having had palpitations in the past when drinking lots of caffeine or spicy food which she had none of that today. Patient reports that her symptoms have been intermittent throughout the day. She reports that runs 1.5 miles daily in the treadmill which she was able to do today without any discomfort. No chest pain or shortness of breath, no vomiting, no diarrhea, no fever or chills, no abdominal pain. Patient denies personal or family history of blood clots, recent travel immobilization, leg pain or swelling, hemoptysis, or exogenous hormones (does use vaginal cream estrogen). No family history of dysrhythmias or ischemic cardiac disease. No syncope or near syncope.Patient reports that she occasionally takes HCTZ for kidney stones. She had not taken it for several months but took one yesterday.   Past Medical History:  Diagnosis Date  . Anxiety   . Chronic headaches   . Migraines     Patient Active Problem List   Diagnosis Date Noted  . Insomnia 10/20/2011  . RESTLESS LEGS SYNDROME 02/06/2009  . ALLERGIC RHINITIS 01/31/2009  . CONSTIPATION, RECURRENT 01/31/2009  . ELEVATED BLOOD PRESSURE WITHOUT DIAGNOSIS OF HYPERTENSION 01/31/2009    History reviewed. No pertinent surgical history.  Prior to  Admission medications   Medication Sig Start Date End Date Taking? Authorizing Provider  estradiol (ESTRACE) 0.1 MG/GM vaginal cream Place 1 Applicatorful vaginally at bedtime.    [provider]  hydrochlorothiazide (MICROZIDE) 12.5 MG capsule Take 12.5 mg by mouth daily.    [provider]  ibuprofen (ADVIL,MOTRIN) 200 MG tablet Take 200 mg by mouth every 6 (six) hours as needed.    [provider]  meloxicam (MOBIC) 15 MG tablet Take 15 mg by mouth daily.    [provider]  ondansetron (ZOFRAN) 8 MG tablet Take by mouth every 8 (eight) hours as needed for nausea or vomiting.    [provider]  SUMAtriptan (IMITREX) 100 MG tablet Take 100 mg by mouth every 2 (two) hours as needed for migraine. May repeat in 2 hours if headache persists or recurs.    [provider]  traMADol (ULTRAM) 50 MG tablet Take by mouth every 6 (six) hours as needed.    [provider]    Allergies Codeine; Latex; Meperidine hcl; Metoprolol; and Morphine  No family history on file.  Social History Social History   Tobacco Use  . Smoking status: Never Smoker  . Smokeless tobacco: Never Used  Substance Use Topics  . Alcohol use: No  . Drug use: Not on file    Review of Systems  Constitutional: Negative for fever. Eyes: Negative for visual changes. ENT: Negative for sore throat. Neck: No neck pain  Cardiovascular: Negative for chest pain. + palpitations Respiratory: Negative for shortness of breath. Gastrointestinal: Negative for abdominal pain, vomiting or diarrhea. Genitourinary: Negative for dysuria. Musculoskeletal: Negative  for back pain. Skin: Negative for rash. Neurological: Negative for headaches, weakness or numbness. Psych: No SI or HI  ____________________________________________   PHYSICAL EXAM:  VITAL SIGNS: ED Triage Vitals  Enc Vitals Group     BP 07/15/17 1759 (!) 143/91     Pulse Rate 07/15/17 1759 91     Resp  07/15/17 1759 20     Temp 07/15/17 1759 98 F (36.7 C)     Temp Source 07/15/17 1759 Oral     SpO2 07/15/17 1759 100 %     Weight 07/15/17 1800 145 lb (65.8 kg)     Height 07/15/17 1800 5\' 2"  (1.575 m)     Head Circumference --      Peak Flow --      Pain Score 07/15/17 1759 0     Pain Loc --      Pain Edu? --      Excl. in [place]? --     Constitutional: Alert and oriented. Well appearing and in no apparent distress. HEENT:      Head: Normocephalic and atraumatic.         Eyes: Conjunctivae are normal. Sclera is non-icteric.       Mouth/Throat: Mucous membranes are moist.       Neck: Supple with no signs of meningismus. Cardiovascular: Regular rate and rhythm. No murmurs, gallops, or rubs. 2+ symmetrical distal pulses are present in all extremities. No JVD. Respiratory: Normal respiratory effort. Lungs are clear to auscultation bilaterally. No wheezes, crackles, or rhonchi.  Gastrointestinal: Soft, non tender, and non distended with positive bowel sounds. No rebound or guarding. Genitourinary: No CVA tenderness. Musculoskeletal: Nontender with normal range of motion in all extremities. No edema, cyanosis, or erythema of extremities. Neurologic: Normal speech and language. Face is symmetric. Moving all extremities. No gross focal neurologic deficits are appreciated. Skin: Skin is warm, dry and intact. No rash noted. Psychiatric: Mood and affect are normal. Speech and behavior are normal.  ____________________________________________   LABS (all labs ordered are listed, but only abnormal results are displayed)  Labs Reviewed  COMPREHENSIVE METABOLIC PANEL - Abnormal; Notable for the following components:      Result Value   Potassium 3.2 (*)    Total Bilirubin 1.6 (*)    All other components within normal limits  TROPONIN I  CBC  TSH  MAGNESIUM   ____________________________________________  EKG  ED ECG REPORT I, Rudene Re, the attending physician, personally  viewed and interpreted this ECG.  18:08 - Normal sinus rhythm, rate of 94, normal intervals, normal axis, no ST elevations or depressions, diffuse T-wave flattening. Flattening is new when compared to prior from 2010.  21:17 - normal sinus rhythm, rate of 83, normal intervals, normal axis, no ST elevations or depressions, T-wave inversion in lead 3. Unchanged from prior from 2010. ____________________________________________  RADIOLOGY  none ____________________________________________   PROCEDURES  Procedure(s) performed: None Procedures Critical Care performed:  None ____________________________________________   INITIAL IMPRESSION / ASSESSMENT AND PLAN / ED COURSE  61 y.o. female with a history of anxiety and migraine headaches who presents for evaluation of palpitations since this am. EKG shows no evidence of dysrhythmia. EKG machine reading as prolonged QT however, calculated QT interval is 400 ms. No evidence of anemia, thyroid disease, or significant electrolyte abnormalities. Patient's potassium is 3.2 which will be supplemented by mouth. Possibly from HCTZ. Troponin is also negative. Unclear etiology of her palpitations however patient is currently in normal sinus rhythm at this time. I  will refer her to cardiology for Holter monitoring.    _________________________ 9:50 PM on 07/15/2017 -----------------------------------------  Mag, TSH WNL. Patient continues to experience palpitations while on telemetry here with no obvious abnormalities, NSR with no PVCs or dysrhythmias. Recommended that she supplements by mouth potassium while on hydrochlorothiazide and follow-up with her cardiologist for Holter monitoring if her symptoms are persistent. Her repeat EKG showing normal intervals with no QTc. Discussed return precautions with patient and her husband.   As part of my medical decision making, I reviewed the following data within the [place] notes  reviewed and incorporated, Labs reviewed , EKG interpreted , Old EKG reviewed, Notes from prior ED visits and [place] Controlled Substance Database    Pertinent labs & imaging results that were available during my care of the patient were reviewed by me and considered in my medical decision making (see chart for details).    ____________________________________________   FINAL CLINICAL IMPRESSION(S) / ED DIAGNOSES  Final diagnoses:  Palpitations      NEW MEDICATIONS STARTED DURING THIS VISIT:  This SmartLink is deprecated. Use AVSMEDLIST instead to display the medication list for a patient.   Note:  This document was prepared using Dragon voice recognition software and may include unintentional dictation errors.    Rudene Re, MD 07/15/17 2152

## 2017-07-15 NOTE — ED Triage Notes (Signed)
Patient presents to the ED with palpitations throughout the day today.  Patient states, "I feel like I'm running a race, I keep feeling skipped beats."  Patient reports history of some anxiety but states she has never felt like this before. Patient denies any chest pain or shortness of breath.  Patient denies dizziness or diaphoresis.

## 2017-07-20 DIAGNOSIS — Z1283 Encounter for screening for malignant neoplasm of skin: Secondary | ICD-10-CM | POA: Diagnosis not present

## 2017-07-20 DIAGNOSIS — B078 Other viral warts: Secondary | ICD-10-CM | POA: Diagnosis not present

## 2017-07-20 DIAGNOSIS — L570 Actinic keratosis: Secondary | ICD-10-CM | POA: Diagnosis not present

## 2017-07-20 DIAGNOSIS — D229 Melanocytic nevi, unspecified: Secondary | ICD-10-CM | POA: Diagnosis not present

## 2017-07-20 DIAGNOSIS — R002 Palpitations: Secondary | ICD-10-CM | POA: Diagnosis not present

## 2017-07-20 DIAGNOSIS — E876 Hypokalemia: Secondary | ICD-10-CM | POA: Diagnosis not present

## 2017-07-20 DIAGNOSIS — D1801 Hemangioma of skin and subcutaneous tissue: Secondary | ICD-10-CM | POA: Diagnosis not present

## 2017-07-21 DIAGNOSIS — N281 Cyst of kidney, acquired: Secondary | ICD-10-CM | POA: Diagnosis not present

## 2017-07-21 DIAGNOSIS — N132 Hydronephrosis with renal and ureteral calculous obstruction: Secondary | ICD-10-CM | POA: Diagnosis not present

## 2017-07-21 DIAGNOSIS — N200 Calculus of kidney: Secondary | ICD-10-CM | POA: Diagnosis not present

## 2017-07-21 DIAGNOSIS — N1330 Unspecified hydronephrosis: Secondary | ICD-10-CM | POA: Diagnosis not present

## 2017-07-21 DIAGNOSIS — Z79899 Other long term (current) drug therapy: Secondary | ICD-10-CM | POA: Diagnosis not present

## 2017-08-03 DIAGNOSIS — E876 Hypokalemia: Secondary | ICD-10-CM | POA: Diagnosis not present

## 2017-08-13 DIAGNOSIS — N200 Calculus of kidney: Secondary | ICD-10-CM | POA: Diagnosis not present

## 2017-08-13 DIAGNOSIS — N132 Hydronephrosis with renal and ureteral calculous obstruction: Secondary | ICD-10-CM | POA: Diagnosis not present

## 2017-08-13 DIAGNOSIS — N1330 Unspecified hydronephrosis: Secondary | ICD-10-CM | POA: Diagnosis not present

## 2017-09-29 DIAGNOSIS — G43009 Migraine without aura, not intractable, without status migrainosus: Secondary | ICD-10-CM | POA: Diagnosis not present

## 2017-11-03 DIAGNOSIS — N200 Calculus of kidney: Secondary | ICD-10-CM | POA: Diagnosis not present

## 2017-12-08 DIAGNOSIS — M9901 Segmental and somatic dysfunction of cervical region: Secondary | ICD-10-CM | POA: Diagnosis not present

## 2017-12-08 DIAGNOSIS — K219 Gastro-esophageal reflux disease without esophagitis: Secondary | ICD-10-CM | POA: Diagnosis not present

## 2017-12-08 DIAGNOSIS — Z01419 Encounter for gynecological examination (general) (routine) without abnormal findings: Secondary | ICD-10-CM | POA: Diagnosis not present

## 2017-12-08 DIAGNOSIS — M5413 Radiculopathy, cervicothoracic region: Secondary | ICD-10-CM | POA: Diagnosis not present

## 2017-12-08 DIAGNOSIS — M9902 Segmental and somatic dysfunction of thoracic region: Secondary | ICD-10-CM | POA: Diagnosis not present

## 2017-12-10 DIAGNOSIS — M9902 Segmental and somatic dysfunction of thoracic region: Secondary | ICD-10-CM | POA: Diagnosis not present

## 2017-12-10 DIAGNOSIS — M9901 Segmental and somatic dysfunction of cervical region: Secondary | ICD-10-CM | POA: Diagnosis not present

## 2017-12-10 DIAGNOSIS — M5413 Radiculopathy, cervicothoracic region: Secondary | ICD-10-CM | POA: Diagnosis not present

## 2017-12-14 DIAGNOSIS — M9902 Segmental and somatic dysfunction of thoracic region: Secondary | ICD-10-CM | POA: Diagnosis not present

## 2017-12-14 DIAGNOSIS — M9901 Segmental and somatic dysfunction of cervical region: Secondary | ICD-10-CM | POA: Diagnosis not present

## 2017-12-14 DIAGNOSIS — M5413 Radiculopathy, cervicothoracic region: Secondary | ICD-10-CM | POA: Diagnosis not present

## 2017-12-16 DIAGNOSIS — M9902 Segmental and somatic dysfunction of thoracic region: Secondary | ICD-10-CM | POA: Diagnosis not present

## 2017-12-16 DIAGNOSIS — M9901 Segmental and somatic dysfunction of cervical region: Secondary | ICD-10-CM | POA: Diagnosis not present

## 2017-12-16 DIAGNOSIS — M5413 Radiculopathy, cervicothoracic region: Secondary | ICD-10-CM | POA: Diagnosis not present

## 2017-12-21 DIAGNOSIS — M9901 Segmental and somatic dysfunction of cervical region: Secondary | ICD-10-CM | POA: Diagnosis not present

## 2017-12-21 DIAGNOSIS — M5413 Radiculopathy, cervicothoracic region: Secondary | ICD-10-CM | POA: Diagnosis not present

## 2017-12-21 DIAGNOSIS — M9902 Segmental and somatic dysfunction of thoracic region: Secondary | ICD-10-CM | POA: Diagnosis not present

## 2017-12-28 DIAGNOSIS — M5413 Radiculopathy, cervicothoracic region: Secondary | ICD-10-CM | POA: Diagnosis not present

## 2017-12-28 DIAGNOSIS — M9901 Segmental and somatic dysfunction of cervical region: Secondary | ICD-10-CM | POA: Diagnosis not present

## 2017-12-28 DIAGNOSIS — M9902 Segmental and somatic dysfunction of thoracic region: Secondary | ICD-10-CM | POA: Diagnosis not present

## 2017-12-31 DIAGNOSIS — M5413 Radiculopathy, cervicothoracic region: Secondary | ICD-10-CM | POA: Diagnosis not present

## 2017-12-31 DIAGNOSIS — M9902 Segmental and somatic dysfunction of thoracic region: Secondary | ICD-10-CM | POA: Diagnosis not present

## 2017-12-31 DIAGNOSIS — M9901 Segmental and somatic dysfunction of cervical region: Secondary | ICD-10-CM | POA: Diagnosis not present

## 2018-01-01 DIAGNOSIS — M9901 Segmental and somatic dysfunction of cervical region: Secondary | ICD-10-CM | POA: Diagnosis not present

## 2018-01-01 DIAGNOSIS — M5413 Radiculopathy, cervicothoracic region: Secondary | ICD-10-CM | POA: Diagnosis not present

## 2018-01-01 DIAGNOSIS — M9902 Segmental and somatic dysfunction of thoracic region: Secondary | ICD-10-CM | POA: Diagnosis not present

## 2018-01-05 DIAGNOSIS — M9902 Segmental and somatic dysfunction of thoracic region: Secondary | ICD-10-CM | POA: Diagnosis not present

## 2018-01-05 DIAGNOSIS — M9901 Segmental and somatic dysfunction of cervical region: Secondary | ICD-10-CM | POA: Diagnosis not present

## 2018-01-05 DIAGNOSIS — M5413 Radiculopathy, cervicothoracic region: Secondary | ICD-10-CM | POA: Diagnosis not present

## 2018-01-11 DIAGNOSIS — M5413 Radiculopathy, cervicothoracic region: Secondary | ICD-10-CM | POA: Diagnosis not present

## 2018-01-11 DIAGNOSIS — M9902 Segmental and somatic dysfunction of thoracic region: Secondary | ICD-10-CM | POA: Diagnosis not present

## 2018-01-11 DIAGNOSIS — M9901 Segmental and somatic dysfunction of cervical region: Secondary | ICD-10-CM | POA: Diagnosis not present

## 2018-01-15 DIAGNOSIS — M9901 Segmental and somatic dysfunction of cervical region: Secondary | ICD-10-CM | POA: Diagnosis not present

## 2018-01-15 DIAGNOSIS — M9902 Segmental and somatic dysfunction of thoracic region: Secondary | ICD-10-CM | POA: Diagnosis not present

## 2018-01-15 DIAGNOSIS — M5413 Radiculopathy, cervicothoracic region: Secondary | ICD-10-CM | POA: Diagnosis not present

## 2018-01-25 DIAGNOSIS — M9901 Segmental and somatic dysfunction of cervical region: Secondary | ICD-10-CM | POA: Diagnosis not present

## 2018-01-25 DIAGNOSIS — M9902 Segmental and somatic dysfunction of thoracic region: Secondary | ICD-10-CM | POA: Diagnosis not present

## 2018-01-25 DIAGNOSIS — M5413 Radiculopathy, cervicothoracic region: Secondary | ICD-10-CM | POA: Diagnosis not present

## 2018-01-28 DIAGNOSIS — D2230 Melanocytic nevi of unspecified part of face: Secondary | ICD-10-CM | POA: Diagnosis not present

## 2018-01-28 DIAGNOSIS — L820 Inflamed seborrheic keratosis: Secondary | ICD-10-CM | POA: Diagnosis not present

## 2018-01-28 DIAGNOSIS — D225 Melanocytic nevi of trunk: Secondary | ICD-10-CM | POA: Diagnosis not present

## 2018-01-28 DIAGNOSIS — B078 Other viral warts: Secondary | ICD-10-CM | POA: Diagnosis not present

## 2018-01-28 DIAGNOSIS — Z1283 Encounter for screening for malignant neoplasm of skin: Secondary | ICD-10-CM | POA: Diagnosis not present

## 2018-01-28 DIAGNOSIS — L570 Actinic keratosis: Secondary | ICD-10-CM | POA: Diagnosis not present

## 2018-02-08 DIAGNOSIS — M9901 Segmental and somatic dysfunction of cervical region: Secondary | ICD-10-CM | POA: Diagnosis not present

## 2018-02-08 DIAGNOSIS — M5413 Radiculopathy, cervicothoracic region: Secondary | ICD-10-CM | POA: Diagnosis not present

## 2018-02-08 DIAGNOSIS — M9902 Segmental and somatic dysfunction of thoracic region: Secondary | ICD-10-CM | POA: Diagnosis not present

## 2018-02-09 DIAGNOSIS — M9902 Segmental and somatic dysfunction of thoracic region: Secondary | ICD-10-CM | POA: Diagnosis not present

## 2018-02-09 DIAGNOSIS — M5413 Radiculopathy, cervicothoracic region: Secondary | ICD-10-CM | POA: Diagnosis not present

## 2018-02-09 DIAGNOSIS — M9901 Segmental and somatic dysfunction of cervical region: Secondary | ICD-10-CM | POA: Diagnosis not present

## 2018-02-17 DIAGNOSIS — M722 Plantar fascial fibromatosis: Secondary | ICD-10-CM | POA: Diagnosis not present

## 2018-02-17 DIAGNOSIS — M50322 Other cervical disc degeneration at C5-C6 level: Secondary | ICD-10-CM | POA: Diagnosis not present

## 2018-02-17 DIAGNOSIS — M62838 Other muscle spasm: Secondary | ICD-10-CM | POA: Diagnosis not present

## 2018-02-22 IMAGING — US US ABDOMEN LIMITED
1 series · 14 of 25 positions shown · non-contrast
Comparison: 06/29/2012 ultrasound.

CLINICAL DATA: 60-year-old female with right upper quadrant pain
since November 2016. Post cholecystectomy. Initial encounter.

EXAM:
ULTRASOUND ABDOMEN LIMITED RIGHT UPPER QUADRANT

[Series 1: us abdomen limited · 0.22mm/px · 14 of 52 slices shown]
[im 1/52]
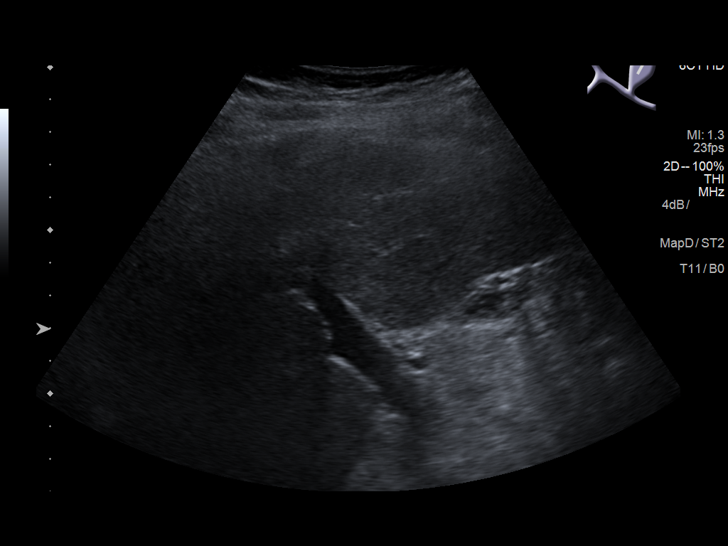
[im 5/52]
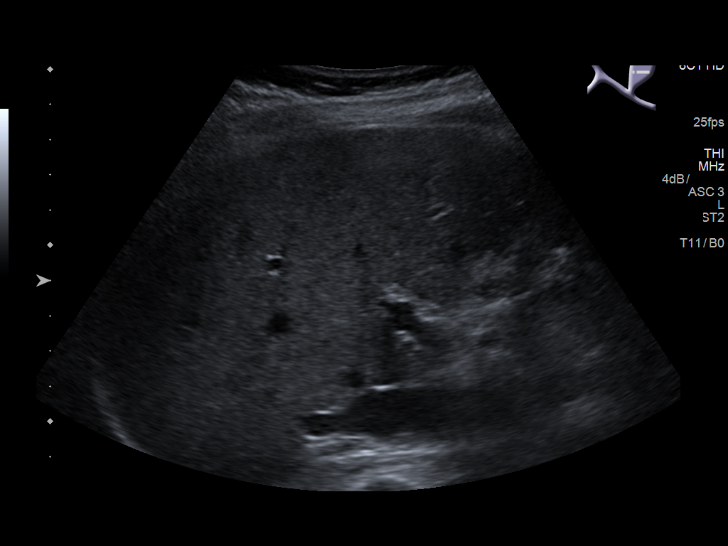
[im 9/52]
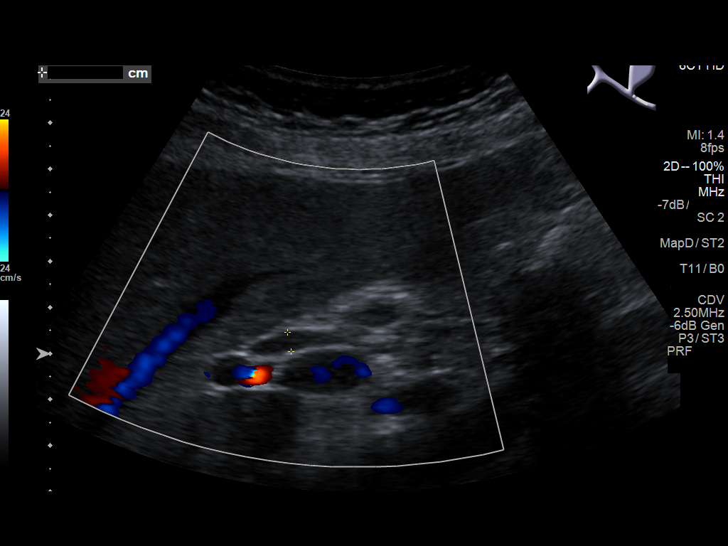
[im 13/52]
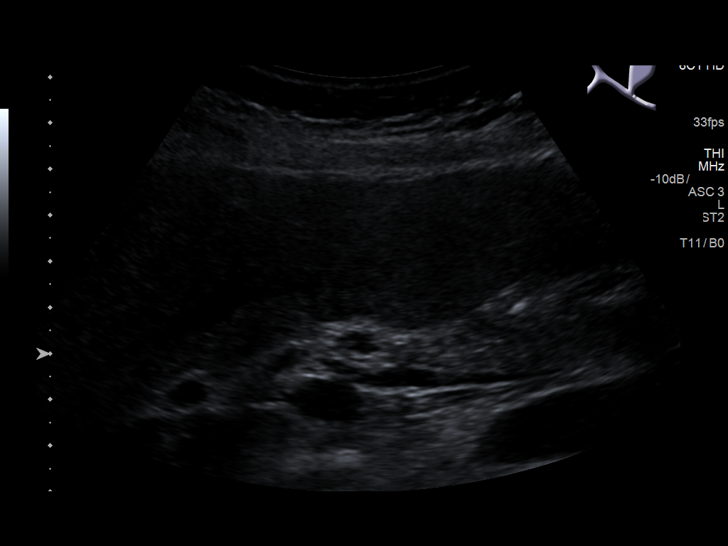
[im 18/52]
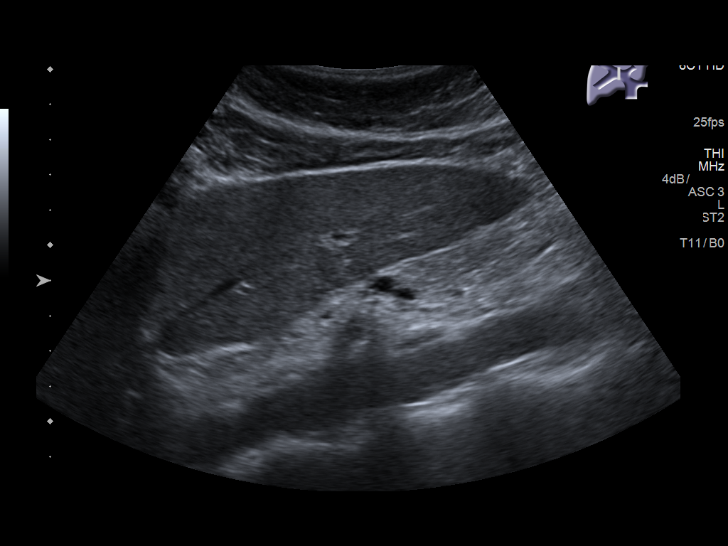
[im 20/52]
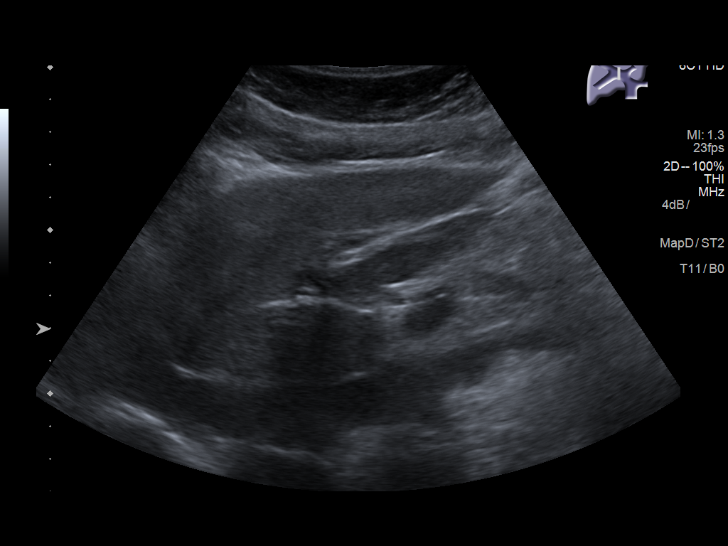
[im 24/52]
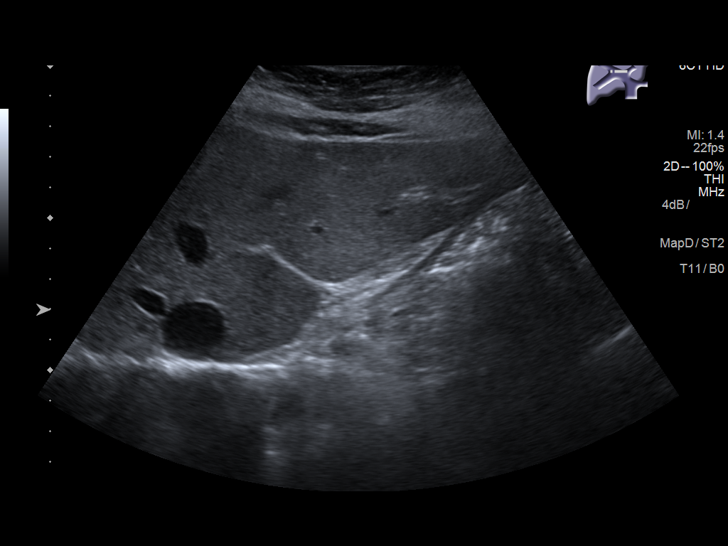
[im 28/52]
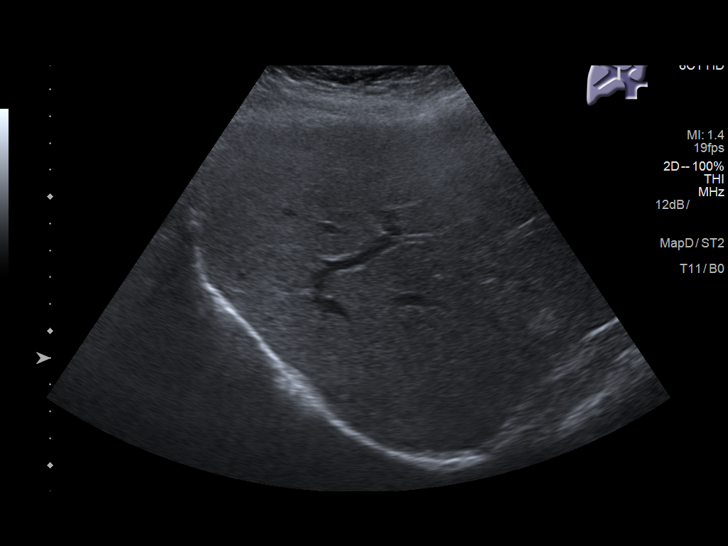
[im 32/52]
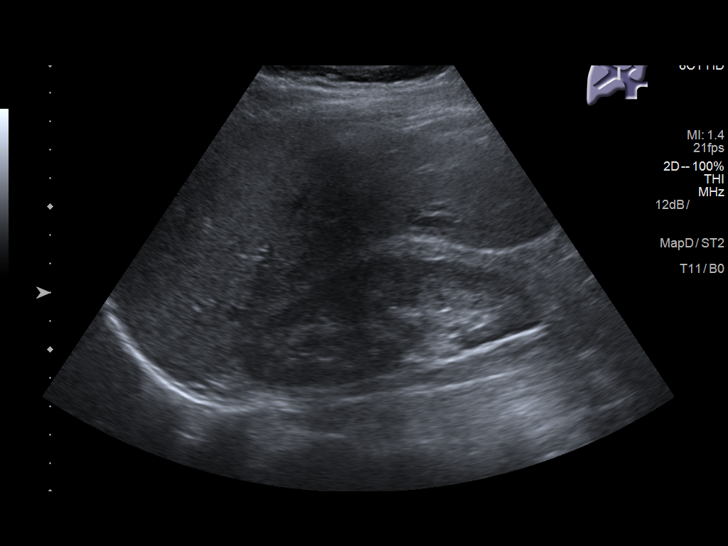
[im 35/52]
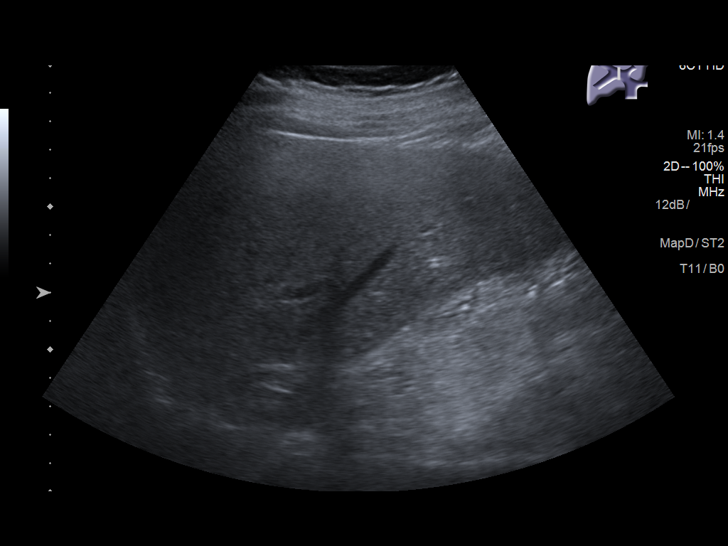
[im 39/52]
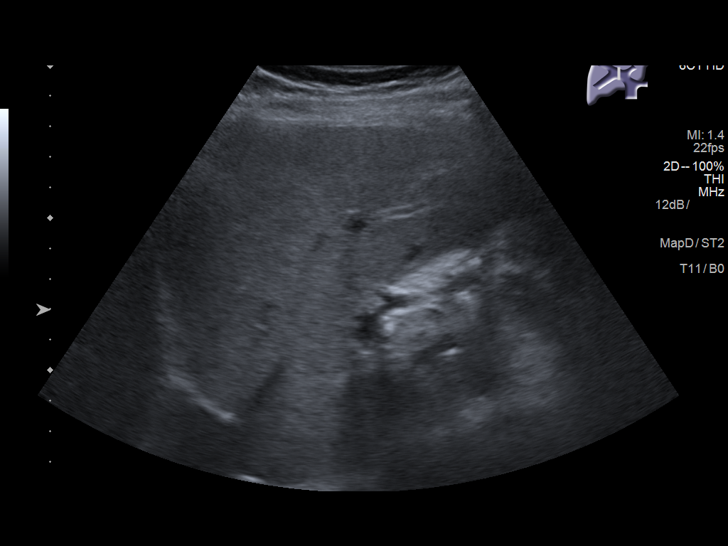
[im 43/52]
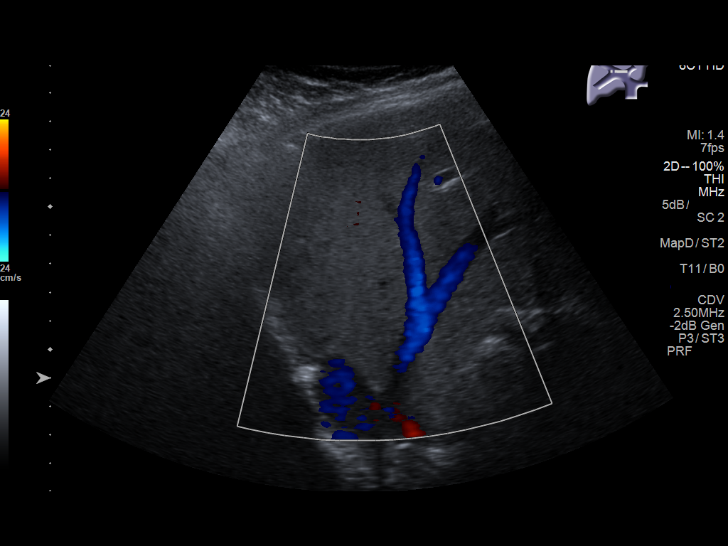
[im 47/52]
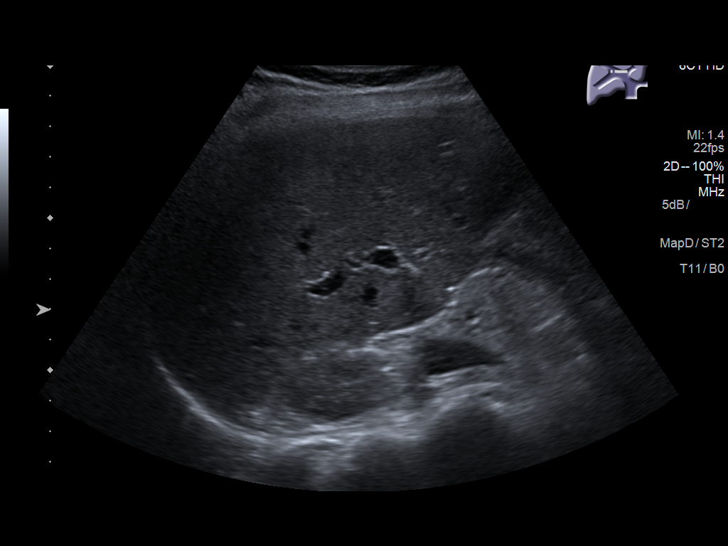
[im 52/52]
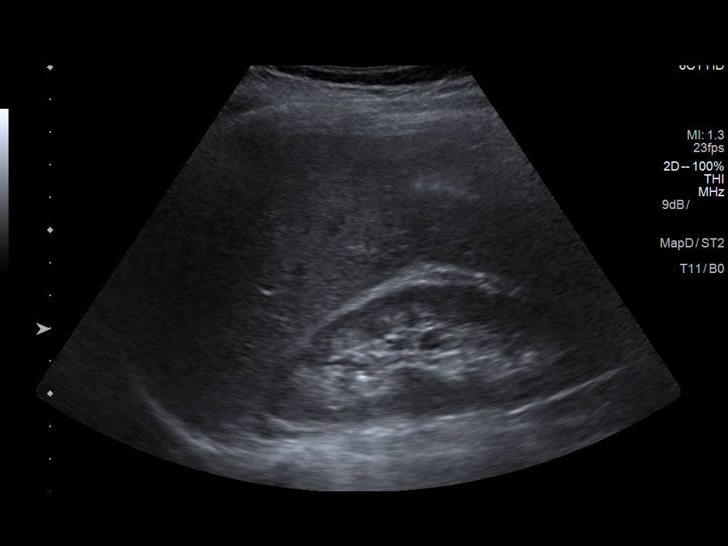

[14 of 25 positions shown; findings below may reference images not displayed]

FINDINGS: Gallbladder:

Post cholecystectomy.

Common bile duct:

Diameter: 4.8 mm taking 2.9 mm.  No common bile duct stone noted

Liver:

No focal lesion identified. Within normal limits in parenchymal
echogenicity.
IMPRESSION: Post cholecystectomy.

Common bile duct size within normal limits without common bile duct
stone noted.

Liver within normal limits without mass or intrahepatic biliary duct
dilation.

## 2018-03-16 DIAGNOSIS — M549 Dorsalgia, unspecified: Secondary | ICD-10-CM | POA: Diagnosis not present

## 2018-04-08 DIAGNOSIS — M549 Dorsalgia, unspecified: Secondary | ICD-10-CM | POA: Diagnosis not present

## 2018-05-31 DIAGNOSIS — M79672 Pain in left foot: Secondary | ICD-10-CM | POA: Diagnosis not present

## 2018-05-31 DIAGNOSIS — M722 Plantar fascial fibromatosis: Secondary | ICD-10-CM | POA: Diagnosis not present

## 2018-06-07 ENCOUNTER — Other Ambulatory Visit: Payer: Self-pay | Admitting: Internal Medicine

## 2018-06-07 DIAGNOSIS — R1011 Right upper quadrant pain: Secondary | ICD-10-CM

## 2018-06-07 DIAGNOSIS — K8050 Calculus of bile duct without cholangitis or cholecystitis without obstruction: Secondary | ICD-10-CM | POA: Diagnosis not present

## 2018-06-09 ENCOUNTER — Other Ambulatory Visit: Payer: Self-pay | Admitting: Internal Medicine

## 2018-06-09 ENCOUNTER — Ambulatory Visit
Admission: RE | Admit: 2018-06-09 | Discharge: 2018-06-09 | Disposition: A | Discharge status: Home / Self Care | Payer: 59 | Source: Ambulatory Visit | Attending: Internal Medicine | Admitting: Internal Medicine

## 2018-06-09 DIAGNOSIS — Z9049 Acquired absence of other specified parts of digestive tract: Secondary | ICD-10-CM | POA: Diagnosis not present

## 2018-06-09 DIAGNOSIS — R109 Unspecified abdominal pain: Secondary | ICD-10-CM | POA: Diagnosis not present

## 2018-06-09 DIAGNOSIS — K8050 Calculus of bile duct without cholangitis or cholecystitis without obstruction: Secondary | ICD-10-CM

## 2018-06-09 DIAGNOSIS — Z1231 Encounter for screening mammogram for malignant neoplasm of breast: Secondary | ICD-10-CM | POA: Diagnosis not present

## 2018-06-09 DIAGNOSIS — R1011 Right upper quadrant pain: Secondary | ICD-10-CM

## 2018-06-09 DIAGNOSIS — Z01419 Encounter for gynecological examination (general) (routine) without abnormal findings: Secondary | ICD-10-CM | POA: Diagnosis not present

## 2018-06-09 MED ORDER — GADOBENATE DIMEGLUMINE 529 MG/ML IV SOLN
10.0000 mL | Freq: Once | INTRAVENOUS | Status: AC | PRN
  Administered 2018-06-09: 10 mL via INTRAVENOUS

## 2018-06-22 DIAGNOSIS — Z23 Encounter for immunization: Secondary | ICD-10-CM | POA: Diagnosis not present

## 2018-07-05 DIAGNOSIS — K8050 Calculus of bile duct without cholangitis or cholecystitis without obstruction: Secondary | ICD-10-CM | POA: Diagnosis not present

## 2018-07-05 DIAGNOSIS — R1011 Right upper quadrant pain: Secondary | ICD-10-CM | POA: Diagnosis not present

## 2018-07-07 DIAGNOSIS — N200 Calculus of kidney: Secondary | ICD-10-CM | POA: Diagnosis not present

## 2018-07-08 DIAGNOSIS — R109 Unspecified abdominal pain: Secondary | ICD-10-CM | POA: Diagnosis not present

## 2018-07-08 DIAGNOSIS — R1011 Right upper quadrant pain: Secondary | ICD-10-CM | POA: Diagnosis not present

## 2018-07-21 DIAGNOSIS — D1800 Hemangioma unspecified site: Secondary | ICD-10-CM | POA: Diagnosis not present

## 2018-07-21 DIAGNOSIS — Z1283 Encounter for screening for malignant neoplasm of skin: Secondary | ICD-10-CM | POA: Diagnosis not present

## 2018-07-21 DIAGNOSIS — L578 Other skin changes due to chronic exposure to nonionizing radiation: Secondary | ICD-10-CM | POA: Diagnosis not present

## 2018-08-02 DIAGNOSIS — R6881 Early satiety: Secondary | ICD-10-CM | POA: Diagnosis not present

## 2018-08-02 DIAGNOSIS — K602 Anal fissure, unspecified: Secondary | ICD-10-CM | POA: Diagnosis not present

## 2018-08-17 DIAGNOSIS — K219 Gastro-esophageal reflux disease without esophagitis: Secondary | ICD-10-CM | POA: Diagnosis not present

## 2018-08-17 DIAGNOSIS — N200 Calculus of kidney: Secondary | ICD-10-CM | POA: Diagnosis not present

## 2018-08-17 DIAGNOSIS — K589 Irritable bowel syndrome without diarrhea: Secondary | ICD-10-CM | POA: Diagnosis not present

## 2018-09-07 DIAGNOSIS — N200 Calculus of kidney: Secondary | ICD-10-CM | POA: Diagnosis not present

## 2018-09-07 DIAGNOSIS — R1013 Epigastric pain: Secondary | ICD-10-CM | POA: Diagnosis not present

## 2018-09-07 DIAGNOSIS — R1110 Vomiting, unspecified: Secondary | ICD-10-CM | POA: Diagnosis not present

## 2018-09-07 DIAGNOSIS — K219 Gastro-esophageal reflux disease without esophagitis: Secondary | ICD-10-CM | POA: Diagnosis not present

## 2018-09-16 DIAGNOSIS — D131 Benign neoplasm of stomach: Secondary | ICD-10-CM | POA: Diagnosis not present

## 2018-09-16 DIAGNOSIS — K2970 Gastritis, unspecified, without bleeding: Secondary | ICD-10-CM | POA: Diagnosis not present

## 2018-09-16 DIAGNOSIS — K219 Gastro-esophageal reflux disease without esophagitis: Secondary | ICD-10-CM | POA: Diagnosis not present

## 2018-09-16 DIAGNOSIS — R1011 Right upper quadrant pain: Secondary | ICD-10-CM | POA: Diagnosis not present

## 2018-09-23 DIAGNOSIS — R1011 Right upper quadrant pain: Secondary | ICD-10-CM | POA: Diagnosis not present

## 2018-09-23 DIAGNOSIS — R945 Abnormal results of liver function studies: Secondary | ICD-10-CM | POA: Diagnosis not present

## 2018-10-05 DIAGNOSIS — R935 Abnormal findings on diagnostic imaging of other abdominal regions, including retroperitoneum: Secondary | ICD-10-CM | POA: Diagnosis not present

## 2018-10-05 DIAGNOSIS — R1011 Right upper quadrant pain: Secondary | ICD-10-CM | POA: Diagnosis not present

## 2018-10-05 DIAGNOSIS — K219 Gastro-esophageal reflux disease without esophagitis: Secondary | ICD-10-CM | POA: Diagnosis not present

## 2018-10-27 DIAGNOSIS — R945 Abnormal results of liver function studies: Secondary | ICD-10-CM | POA: Diagnosis not present

## 2019-02-04 IMAGING — MR MR ABDOMEN WO/W CM
16 of 17 series · 44 of 48 positions shown · IV contrast (13 ML MULTIHANCE)
Comparison: Ultrasound 03/30/2017.

CLINICAL DATA: 60-year-old with diarrhea, bloating, burping and
right-sided abdominal pain for 5 months. Cholecystectomy.

EXAM:
MRI ABDOMEN WITHOUT AND WITH CONTRAST
TECHNIQUE: Multiplanar multisequence MR imaging of the abdomen was performed
both before and after the administration of intravenous contrast.
CONTRAST:  13mL MULTIHANCE GADOBENATE DIMEGLUMINE 529 MG/ML IV SOLN

[Series 2: cor ssfse / · coronal · 7.0mm · 1.48mm/px · 2 of 30 slices shown]
[im 1/30]
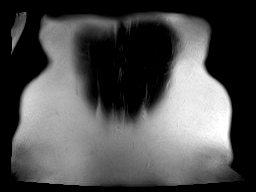
[im 30/30]
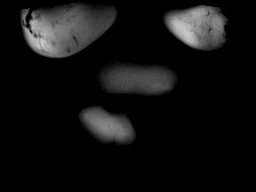

[Series 3: T2 fat-sat · axial · 6.0mm · 1.48mm/px · z∈[-55,+154]mm · 2 of 30 slices shown]
[im 1/30]
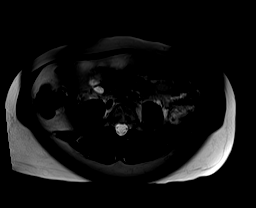
[im 30/30]
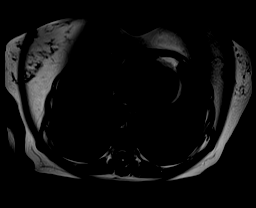

[Series 4: T1 · axial · 6.0mm · 0.74mm/px · z∈[-55,+154]mm · 4 of 60 slices shown]
[im 1/60]
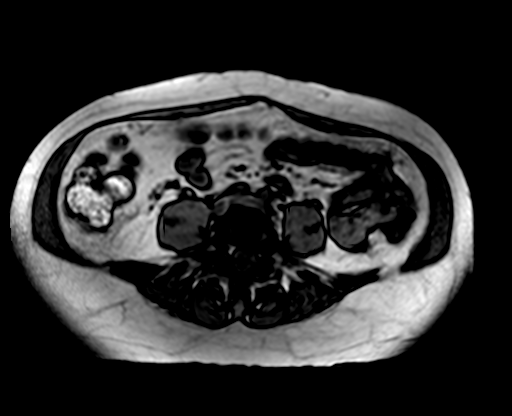
[im 20/60]
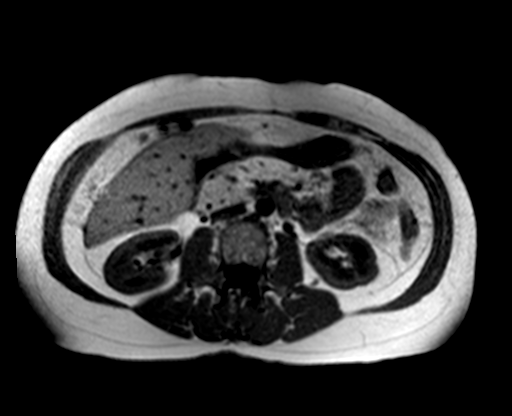
[im 40/60]
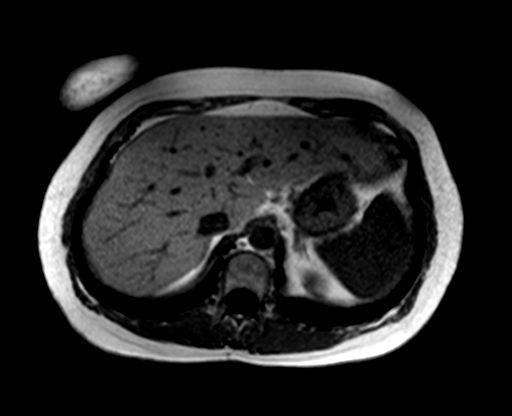
[im 60/60]
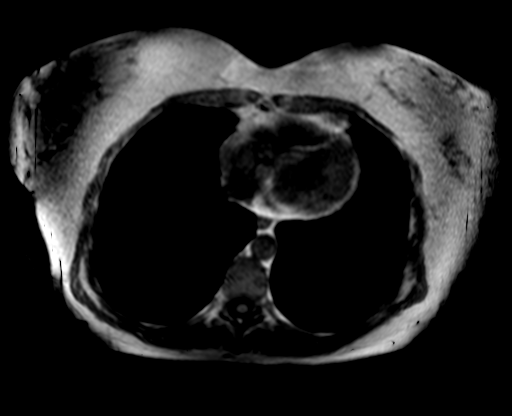

[Series 6: DWI · axial · 6.0mm · 2.00mm/px · z∈[-55,+154]mm · 5 of 90 slices shown (1 of 2)]
[im 1/90]
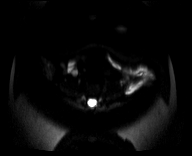
[im 23/90]
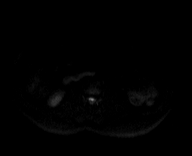
[im 45/90]
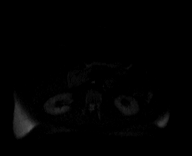
[im 67/90]
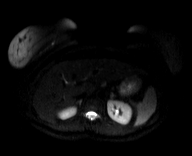
[im 90/90]
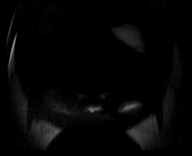

[Series 7: DWI · axial · 6.0mm · 2.00mm/px · z∈[-55,+154]mm · 2 of 30 slices shown (2 of 2)]
[im 1/30]
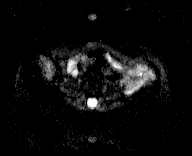
[im 30/30]
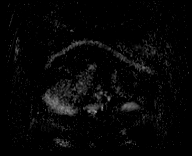

[Series 8: bSSFP · axial · 6.0mm · 0.74mm/px · 1 of 30 slices shown]
[im 1/30]
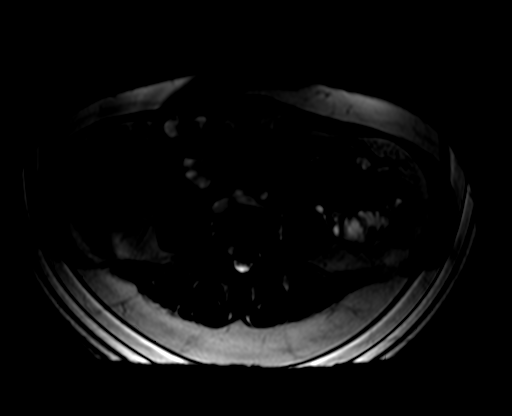

[Series 9: axial dynamic pre · axial · non-contrast · 3.0mm · 1.19mm/px · z∈[-60,+153]mm · 3 of 72 slices shown]
[im 1/72]
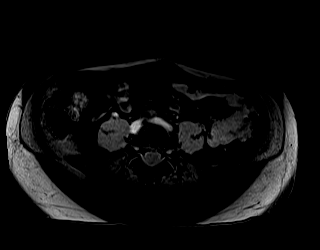
[im 36/72]
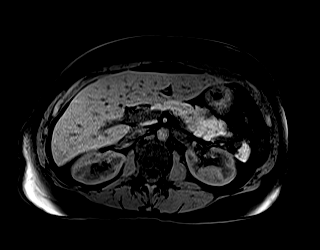
[im 72/72]
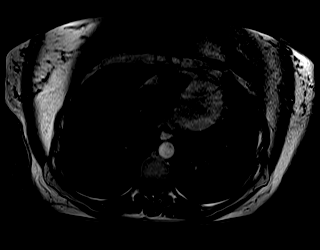

[Series 10: axial dynamic post · axial · 3.0mm · 1.19mm/px · z∈[-60,+153]mm · 3 of 72 slices shown (1 of 6)]
[im 1/72]
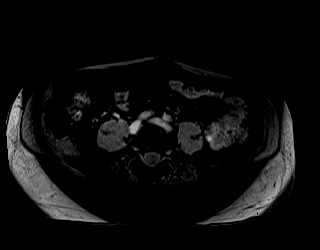
[im 36/72]
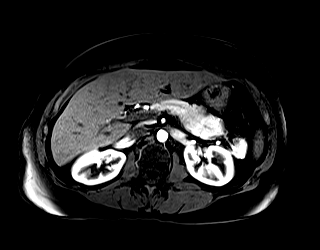
[im 72/72]
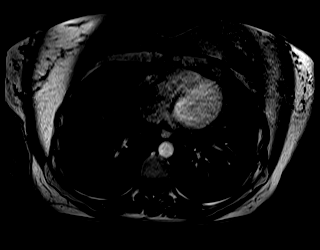

[Series 11: axial dynamic post · axial · 3.0mm · 1.19mm/px · z∈[-60,+153]mm · 3 of 72 slices shown (2 of 6)]
[im 1/72]
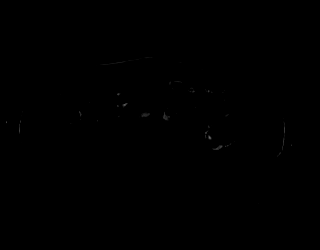
[im 36/72]
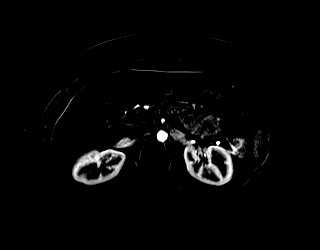
[im 72/72]
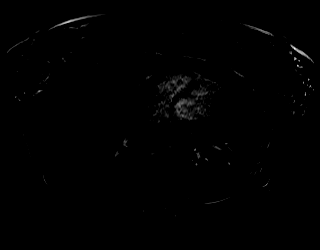

[Series 12: axial dynamic post · axial · 3.0mm · 1.19mm/px · z∈[-60,+153]mm · 3 of 72 slices shown (3 of 6)]
[im 1/72]
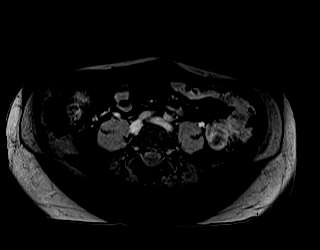
[im 36/72]
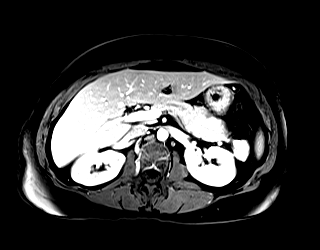
[im 72/72]
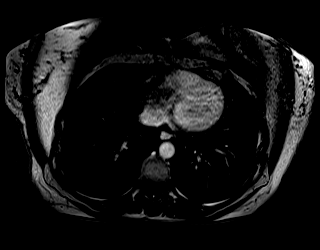

[Series 13: axial dynamic post · axial · 3.0mm · 1.19mm/px · z∈[-60,+153]mm · 3 of 72 slices shown (4 of 6)]
[im 1/72]
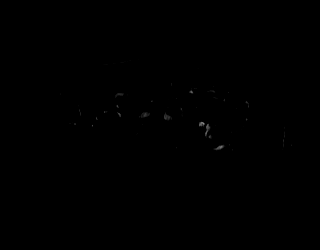
[im 36/72]
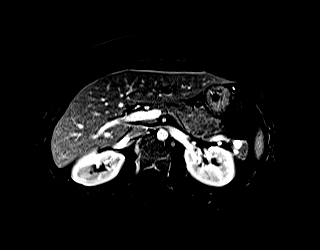
[im 72/72]
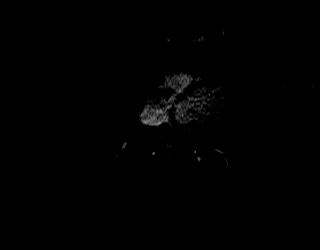

[Series 14: axial dynamic post · axial · 3.0mm · 1.19mm/px · z∈[-60,+153]mm · 3 of 72 slices shown (5 of 6)]
[im 1/72]
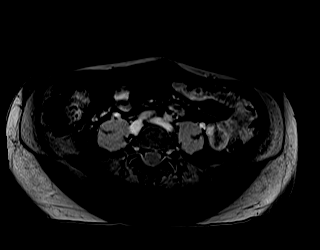
[im 36/72]
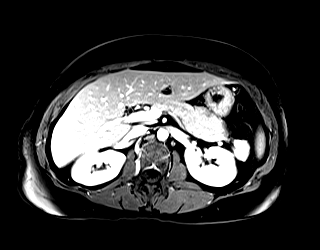
[im 72/72]
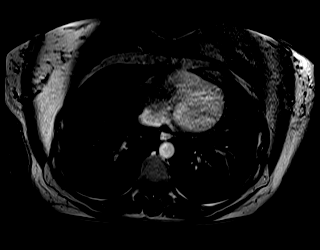

[Series 15: axial dynamic post · axial · 3.0mm · 1.19mm/px · z∈[-60,+153]mm · 3 of 72 slices shown (6 of 6)]
[im 1/72]
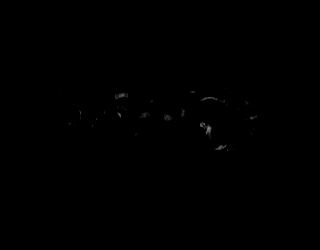
[im 36/72]
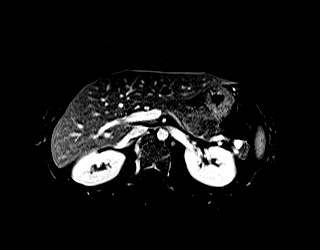
[im 72/72]
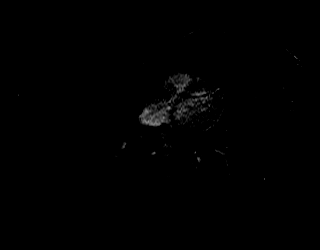

[Series 17: axial dynamic delayed · axial · 3.0mm · 1.19mm/px · z∈[-60,+153]mm · 3 of 72 slices shown]
[im 1/72]
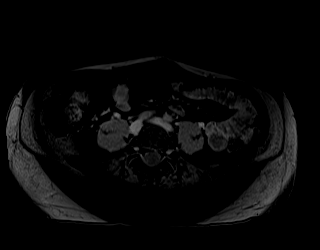
[im 36/72]
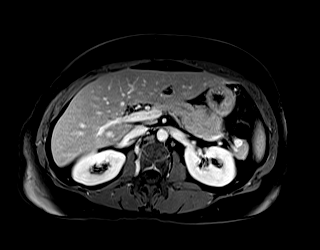
[im 72/72]
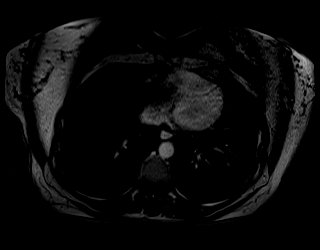

[Series 18: axial dynamic delayed_sub · axial · 3.0mm · 1.19mm/px · z∈[-60,+153]mm · 3 of 72 slices shown]
[im 1/72]
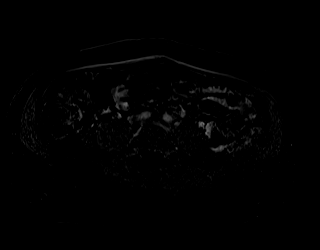
[im 36/72]
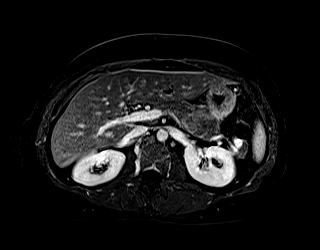
[im 72/72]
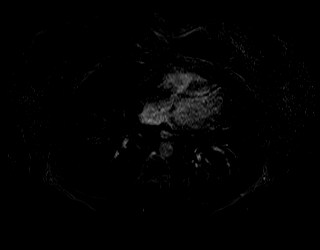

[Series 19: axial ssfse / · axial · 6.0mm · 1.19mm/px · 1 of 30 slices shown]
[im 1/30]
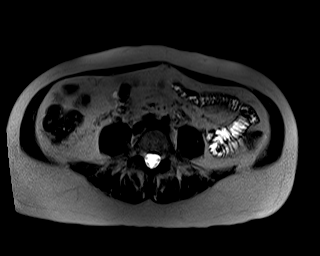

[44 of 48 positions shown; findings below may reference images not displayed]

FINDINGS: Lower chest:  The visualized lower chest appears unremarkable.

Hepatobiliary: No focal hepatic abnormalities are identified. There
is a borderline loss of signal within the liver on the gradient echo
opposed phase images, suggesting possible mild steatosis.
Post-contrast, the liver enhances normally. Status post
cholecystectomy. No biliary dilatation. No evidence of
choledocholithiasis.

Pancreas: Unremarkable. No pancreatic ductal dilatation or
surrounding inflammatory changes.

Spleen: Normal in size without focal abnormality.

Adrenals/Urinary Tract: Both adrenal glands appear normal. There is
a 13 mm cyst in the interpolar region of the left kidney. No
evidence of renal mass or hydronephrosis.

Stomach/Bowel: No evidence of bowel wall thickening, distention or
surrounding inflammatory change.

Vascular/Lymphatic: There are no enlarged abdominal lymph nodes. No
significant vascular findings are present.

Other: No ascites.

Musculoskeletal: No acute or significant osseous findings.
IMPRESSION: 1. No acute findings or explanation for the patient's symptoms.
2. No biliary dilatation post cholecystectomy.
3. Possible mild hepatic steatosis.

## 2019-11-03 DIAGNOSIS — D239 Other benign neoplasm of skin, unspecified: Secondary | ICD-10-CM

## 2019-11-03 HISTORY — DX: Other benign neoplasm of skin, unspecified: D23.9

## 2019-11-22 ENCOUNTER — Ambulatory Visit: Payer: 59

## 2019-11-28 ENCOUNTER — Other Ambulatory Visit: Payer: Self-pay

## 2019-11-28 ENCOUNTER — Ambulatory Visit (INDEPENDENT_AMBULATORY_CARE_PROVIDER_SITE_OTHER): Payer: Self-pay

## 2019-11-28 DIAGNOSIS — L578 Other skin changes due to chronic exposure to nonionizing radiation: Secondary | ICD-10-CM

## 2019-11-28 DIAGNOSIS — L821 Other seborrheic keratosis: Secondary | ICD-10-CM

## 2020-11-05 ENCOUNTER — Ambulatory Visit: Payer: 59 | Admitting: Dermatology

## 2020-11-05 ENCOUNTER — Other Ambulatory Visit: Payer: Self-pay

## 2020-11-05 DIAGNOSIS — L700 Acne vulgaris: Secondary | ICD-10-CM | POA: Diagnosis not present

## 2020-11-05 DIAGNOSIS — L578 Other skin changes due to chronic exposure to nonionizing radiation: Secondary | ICD-10-CM | POA: Diagnosis not present

## 2020-11-05 DIAGNOSIS — L304 Erythema intertrigo: Secondary | ICD-10-CM

## 2020-11-05 DIAGNOSIS — L821 Other seborrheic keratosis: Secondary | ICD-10-CM

## 2020-11-05 DIAGNOSIS — Z1283 Encounter for screening for malignant neoplasm of skin: Secondary | ICD-10-CM

## 2020-11-05 DIAGNOSIS — L820 Inflamed seborrheic keratosis: Secondary | ICD-10-CM

## 2020-11-05 DIAGNOSIS — L814 Other melanin hyperpigmentation: Secondary | ICD-10-CM

## 2020-11-05 DIAGNOSIS — D1800 Hemangioma unspecified site: Secondary | ICD-10-CM

## 2020-11-05 DIAGNOSIS — L570 Actinic keratosis: Secondary | ICD-10-CM

## 2020-11-05 DIAGNOSIS — D229 Melanocytic nevi, unspecified: Secondary | ICD-10-CM

## 2020-11-05 DIAGNOSIS — Z86018 Personal history of other benign neoplasm: Secondary | ICD-10-CM

## 2020-11-05 NOTE — Progress Notes (Signed)
Follow-Up Visit   Subjective  Alejandra Ward is a 65 y.o. female who presents for the following: TBSE (Patient here for full body skin exam and skin cancer screening. Patient with hx of dysplastic nevus. She is not aware of anything new or changing. ). The patient presents for Total-Body Skin Exam (TBSE) for skin cancer screening and mole check.  The following portions of the chart were reviewed this encounter and updated as appropriate:   Tobacco  Allergies  Meds  Problems  Med Hx  Surg Hx  Fam Hx     Review of Systems:  No other skin or systemic complaints except as noted in HPI or Assessment and Plan.  Objective  Well appearing patient in no apparent distress; mood and affect are within normal limits.  A full examination was performed including scalp, head, eyes, ears, nose, lips, neck, chest, axillae, abdomen, back, buttocks, bilateral upper extremities, bilateral lower extremities, hands, feet, fingers, toes, fingernails, and toenails. All findings within normal limits unless otherwise noted below.  Objective  R temple x 1, dorsum nose x 1 (2): Erythematous thin papules/macules with gritty scale.   Objective  left cheek x 1: Erythematous keratotic or waxy stuck-on papule or plaque.   Objective  Right Inframammary Fold: Hx of intertrigo   Assessment & Plan  AK (actinic keratosis) (2) R temple x 1, dorsum nose x 1  Destruction of lesion - R temple x 1, dorsum nose x 1 Complexity: simple   Destruction method: cryotherapy   Informed consent: discussed and consent obtained   Timeout:  patient name, date of birth, surgical site, and procedure verified Lesion destroyed using liquid nitrogen: Yes   Region frozen until ice ball extended beyond lesion: Yes   Outcome: patient tolerated procedure well with no complications   Post-procedure details: wound care instructions given    Inflamed seborrheic keratosis left cheek x 1  Destruction of lesion - left cheek x  1 Complexity: simple   Destruction method: cryotherapy   Informed consent: discussed and consent obtained   Timeout:  patient name, date of birth, surgical site, and procedure verified Lesion destroyed using liquid nitrogen: Yes   Region frozen until ice ball extended beyond lesion: Yes   Outcome: patient tolerated procedure well with no complications   Post-procedure details: wound care instructions given    Erythema intertrigo Right Inframammary Fold Start [facility]  Iodoquinol: 1% Hydrocortisone: 2.5% Niacinamide: 2% twice daily as needed  Intertrigo is a chronic recurrent rash that occurs in skin fold areas that may be associated with friction; heat; moisture; yeast; fungus; and bacteria.  It is exacerbated by increased movement / activity; sweating; and higher atmospheric temperature.  Acne vulgaris face Chronic; persistent. Start [facility] dapsone and niacinamide QD  Skin cancer screening   Lentigines - Scattered tan macules - Due to sun exposure - Benign-appering, observe - Recommend daily broad spectrum sunscreen SPF 30+ to sun-exposed areas, reapply every 2 hours as needed. - Call for any changes  Seborrheic Keratoses - Stuck-on, waxy, tan-brown papules and plaques  - Discussed benign etiology and prognosis. - Observe - Call for any changes  Melanocytic Nevi - Tan-brown and/or pink-flesh-colored symmetric macules and papules - Benign appearing on exam today - Observation - Call clinic for new or changing moles - Recommend daily use of broad spectrum spf 30+ sunscreen to sun-exposed areas.   Hemangiomas - Red papules - Discussed benign nature - Observe - Call for any changes  Actinic Damage -  Chronic, secondary to cumulative UV/sun exposure - diffuse scaly erythematous macules with underlying dyspigmentation - Recommend daily broad spectrum sunscreen SPF 30+ to sun-exposed areas, reapply every 2 hours as needed.  - Call for new or changing  lesions.  Skin cancer screening performed today.  History of Dysplastic Nevi - No evidence of recurrence today at LUQA costal area, moderate - Recommend regular full body skin exams - Recommend daily broad spectrum sunscreen SPF 30+ to sun-exposed areas, reapply every 2 hours as needed.  - Call if any new or changing lesions are noted between office visits  Return in about 1 year (around 11/05/2021) for TBSE.  Graciella Belton, RMA, am acting as scribe for Sarina Ser, MD . Documentation: I have reviewed the above documentation for accuracy and completeness, and I agree with the above.  Sarina Ser, MD

## 2020-11-05 NOTE — Patient Instructions (Addendum)
Melanoma ABCDEs  Melanoma is the most dangerous type of skin cancer, and is the leading cause of death from skin disease.  You are more likely to develop melanoma if you:  Have light-colored skin, light-colored eyes, or red or blond hair  Spend a lot of time in the sun  Tan regularly, either outdoors or in a tanning bed  Have had blistering sunburns, especially during childhood  Have a close family member who has had a melanoma  Have atypical moles or large birthmarks  Early detection of melanoma is key since treatment is typically straightforward and cure rates are extremely high if we catch it early.   The first sign of melanoma is often a change in a mole or a new dark spot.  The ABCDE system is a way of remembering the signs of melanoma.  A for asymmetry:  The two halves do not match. B for border:  The edges of the growth are irregular. C for color:  A mixture of colors are present instead of an even brown color. D for diameter:  Melanomas are usually (but not always) greater than 31mm - the size of a pencil eraser. E for evolution:  The spot keeps changing in size, shape, and color.  Please check your skin once per month between visits. You can use a small mirror in front and a large mirror behind you to keep an eye on the back side or your body.   If you see any new or changing lesions before your next follow-up, please call to schedule a visit.  Please continue daily skin protection including broad spectrum sunscreen SPF 30+ to sun-exposed areas, reapplying every 2 hours as needed when you're outdoors.   Cryotherapy Aftercare  . Wash gently with soap and water everyday.   Marland Kitchen Apply Vaseline and Band-Aid daily until healed.   Instructions for [facility] Medications  One or more of your medications was sent to the [facility] mail order compounding pharmacy. You will receive an email from them and can purchase the medicine through that link. It will then be mailed  to your home at the address you confirmed. If for any reason you do not receive an email from them, please check your spam folder. If you still do not find the email, please let us know. [facility] phone number is [phone].

## 2020-11-06 ENCOUNTER — Encounter: Payer: Self-pay | Admitting: Dermatology

## 2021-01-10 ENCOUNTER — Ambulatory Visit: Payer: 59 | Admitting: Dermatology

## 2021-01-10 ENCOUNTER — Other Ambulatory Visit: Payer: Self-pay

## 2021-01-10 DIAGNOSIS — L570 Actinic keratosis: Secondary | ICD-10-CM

## 2021-01-10 DIAGNOSIS — L578 Other skin changes due to chronic exposure to nonionizing radiation: Secondary | ICD-10-CM | POA: Diagnosis not present

## 2021-01-10 DIAGNOSIS — L821 Other seborrheic keratosis: Secondary | ICD-10-CM

## 2021-01-10 DIAGNOSIS — L820 Inflamed seborrheic keratosis: Secondary | ICD-10-CM | POA: Diagnosis not present

## 2021-01-10 NOTE — Progress Notes (Signed)
   Follow-Up Visit   Subjective  Alejandra Ward is a 65 y.o. female who presents for the following: Actinic Keratosis (Follow up of dorsum nose treated with LN2) and Other (Spots on chest, face and scalp that she would just like checked).  The following portions of the chart were reviewed this encounter and updated as appropriate:   Tobacco  Allergies  Meds  Problems  Med Hx  Surg Hx  Fam Hx     Review of Systems:  No other skin or systemic complaints except as noted in HPI or Assessment and Plan.  Objective  Well appearing patient in no apparent distress; mood and affect are within normal limits.  A focused examination was performed including scalp, face, chest. Relevant physical exam findings are noted in the Assessment and Plan.  Objective  Face (6): Erythematous thin papules/macules with gritty scale.   Objective  Chest x 2, left arm x 1 (3): Erythematous keratotic or waxy stuck-on papule or plaque.    Assessment & Plan    Actinic Damage - chronic, secondary to cumulative UV radiation exposure/sun exposure over time - diffuse scaly erythematous macules with underlying dyspigmentation - Recommend daily broad spectrum sunscreen SPF 30+ to sun-exposed areas, reapply every 2 hours as needed.  - Recommend staying in the shade or wearing long sleeves, sun glasses (UVA+UVB protection) and wide brim hats (4-inch brim around the entire circumference of the hat). - Call for new or changing lesions.  Seborrheic Keratoses - Stuck-on, waxy, tan-brown papules and/or plaques  - Benign-appearing - Discussed benign etiology and prognosis. - Observe - Call for any changes.  AK (actinic keratosis) (6) Face  Destruction of lesion - Face Complexity: simple   Destruction method: cryotherapy   Informed consent: discussed and consent obtained   Timeout:  patient name, date of birth, surgical site, and procedure verified Lesion destroyed using liquid nitrogen: Yes   Region  frozen until ice ball extended beyond lesion: Yes   Outcome: patient tolerated procedure well with no complications   Post-procedure details: wound care instructions given    Inflamed seborrheic keratosis (3) Chest x 2, left arm x 1  Destruction of lesion - Chest x 2, left arm x 1 Complexity: simple   Destruction method: cryotherapy   Informed consent: discussed and consent obtained   Timeout:  patient name, date of birth, surgical site, and procedure verified Lesion destroyed using liquid nitrogen: Yes   Region frozen until ice ball extended beyond lesion: Yes   Outcome: patient tolerated procedure well with no complications   Post-procedure details: wound care instructions given    Return for Follow up as scheduled.  I, Ashok Cordia, CMA, am acting as scribe for Sarina Ser, MD .  Documentation: I have reviewed the above documentation for accuracy and completeness, and I agree with the above.  Sarina Ser, MD

## 2021-01-10 NOTE — Patient Instructions (Signed)

## 2021-01-14 ENCOUNTER — Encounter: Payer: Self-pay | Admitting: Dermatology

## 2021-09-22 ENCOUNTER — Encounter: Payer: Self-pay | Admitting: Dermatology

## 2021-11-11 ENCOUNTER — Encounter: Payer: 59 | Admitting: Dermatology

## 2022-04-16 ENCOUNTER — Ambulatory Visit: Payer: 59 | Admitting: Dermatology

## 2022-04-16 DIAGNOSIS — L570 Actinic keratosis: Secondary | ICD-10-CM

## 2022-04-16 DIAGNOSIS — L821 Other seborrheic keratosis: Secondary | ICD-10-CM

## 2022-04-16 DIAGNOSIS — L814 Other melanin hyperpigmentation: Secondary | ICD-10-CM

## 2022-04-16 DIAGNOSIS — Z86018 Personal history of other benign neoplasm: Secondary | ICD-10-CM | POA: Diagnosis not present

## 2022-04-16 DIAGNOSIS — Z1283 Encounter for screening for malignant neoplasm of skin: Secondary | ICD-10-CM | POA: Diagnosis not present

## 2022-04-16 DIAGNOSIS — D239 Other benign neoplasm of skin, unspecified: Secondary | ICD-10-CM

## 2022-04-16 DIAGNOSIS — L578 Other skin changes due to chronic exposure to nonionizing radiation: Secondary | ICD-10-CM

## 2022-04-16 DIAGNOSIS — D1800 Hemangioma unspecified site: Secondary | ICD-10-CM

## 2022-04-16 DIAGNOSIS — D229 Melanocytic nevi, unspecified: Secondary | ICD-10-CM | POA: Diagnosis not present

## 2022-04-16 DIAGNOSIS — B078 Other viral warts: Secondary | ICD-10-CM | POA: Diagnosis not present

## 2022-04-16 NOTE — Progress Notes (Signed)
Follow-Up Visit   Subjective  Alejandra Ward is a 66 y.o. female who presents for the following: Annual Exam (Mole check ). Hx of Dysplastic nevus, Check a new growth to the left 2nd toe.  The patient presents for Total-Body Skin Exam (TBSE) for skin cancer screening and mole check.  The patient has spots, moles and lesions to be evaluated, some may be new or changing and the patient has concerns that these could be cancer.   The following portions of the chart were reviewed this encounter and updated as appropriate:   Tobacco  Allergies  Meds  Problems  Med Hx  Surg Hx  Fam Hx     Review of Systems:  No other skin or systemic complaints except as noted in HPI or Assessment and Plan.  Objective  Well appearing patient in no apparent distress; mood and affect are within normal limits.  A full examination was performed including scalp, head, eyes, ears, nose, lips, neck, chest, axillae, abdomen, back, buttocks, bilateral upper extremities, bilateral lower extremities, hands, feet, fingers, toes, fingernails, and toenails. All findings within normal limits unless otherwise noted below. left forehead x 1 Erythematous thin papules/macules with gritty scale.   left 2nd toe x 1 Verrucous papules -- Discussed viral etiology and contagion.    Assessment & Plan  AK (actinic keratosis) left forehead x 1  Actinic keratoses are precancerous spots that appear secondary to cumulative UV radiation exposure/sun exposure over time. They are chronic with expected duration over 1 year. A portion of actinic keratoses will progress to squamous cell carcinoma of the skin. It is not possible to reliably predict which spots will progress to skin cancer and so treatment is recommended to prevent development of skin cancer.  Recommend daily broad spectrum sunscreen SPF 30+ to sun-exposed areas, reapply every 2 hours as needed.  Recommend staying in the shade or wearing long sleeves, sun glasses  (UVA+UVB protection) and wide brim hats (4-inch brim around the entire circumference of the hat). Call for new or changing lesions.   Destruction of lesion - left forehead x 1 Complexity: simple   Destruction method: cryotherapy   Informed consent: discussed and consent obtained   Timeout:  patient name, date of birth, surgical site, and procedure verified Lesion destroyed using liquid nitrogen: Yes   Region frozen until ice ball extended beyond lesion: Yes   Outcome: patient tolerated procedure well with no complications   Post-procedure details: wound care instructions given    Other viral warts left 2nd toe x 1  Discussed viral etiology and risk of spread.  Discussed multiple treatments may be required to clear warts.  Discussed possible post-treatment dyspigmentation and risk of recurrence.   Destruction of lesion - left 2nd toe x 1 Complexity: simple   Destruction method: cryotherapy   Informed consent: discussed and consent obtained   Timeout:  patient name, date of birth, surgical site, and procedure verified Lesion destroyed using liquid nitrogen: Yes   Region frozen until ice ball extended beyond lesion: Yes   Outcome: patient tolerated procedure well with no complications   Post-procedure details: wound care instructions given    Lentigines - Scattered tan macules - Due to sun exposure - Benign-appearing, observe - Recommend daily broad spectrum sunscreen SPF 30+ to sun-exposed areas, reapply every 2 hours as needed. - Call for any changes  Seborrheic Keratoses - Stuck-on, waxy, tan-brown papules and/or plaques  - Benign-appearing - Discussed benign etiology and prognosis. - Observe - Call for  any changes  Melanocytic Nevi - Tan-brown and/or pink-flesh-colored symmetric macules and papules - Benign appearing on exam today - Observation - Call clinic for new or changing moles - Recommend daily use of broad spectrum spf 30+ sunscreen to sun-exposed areas.    Hemangiomas - Red papules - Discussed benign nature - Observe - Call for any changes  Actinic Damage - Chronic condition, secondary to cumulative UV/sun exposure - diffuse scaly erythematous macules with underlying dyspigmentation - Recommend daily broad spectrum sunscreen SPF 30+ to sun-exposed areas, reapply every 2 hours as needed.  - Staying in the shade or wearing long sleeves, sun glasses (UVA+UVB protection) and wide brim hats (4-inch brim around the entire circumference of the hat) are also recommended for sun protection.  - Call for new or changing lesions.  History of Dysplastic Nevi LUQA costal area 2021 - No evidence of recurrence today - Recommend regular full body skin exams - Recommend daily broad spectrum sunscreen SPF 30+ to sun-exposed areas, reapply every 2 hours as needed.  - Call if any new or changing lesions are noted between office visits   Skin cancer screening performed today.   Return in about 1 year (around 04/17/2023) for TBSE, hx of Dysplastic nevus .  IMarye Round, CMA, am acting as scribe for Sarina Ser, MD .  Documentation: I have reviewed the above documentation for accuracy and completeness, and I agree with the above.  Sarina Ser, MD

## 2022-04-16 NOTE — Patient Instructions (Addendum)
Cryotherapy Aftercare  Wash gently with soap and water everyday.   Apply Vaseline and Band-Aid daily until healed.     Due to recent changes in healthcare laws, you may see results of your pathology and/or laboratory studies on MyChart before the doctors have had a chance to review them. We understand that in some cases there may be results that are confusing or concerning to you. Please understand that not all results are received at the same time and often the doctors may need to interpret multiple results in order to provide you with the best plan of care or course of treatment. Therefore, we ask that you please give us 2 business days to thoroughly review all your results before contacting the office for clarification. Should we see a critical lab result, you will be contacted sooner.   If You Need Anything After Your Visit  If you have any questions or concerns for your doctor, please call our main line at [phone] and press option 4 to reach your doctor's medical assistant. If no one answers, please leave a voicemail as directed and we will return your call as soon as possible. Messages left after 4 pm will be answered the following business day.   You may also send us a message via MyChart. We typically respond to MyChart messages within 1-2 business days.  For prescription refills, please ask your pharmacy to contact our office. Our fax number is [phone].  If you have an urgent issue when the clinic is closed that cannot wait until the next business day, you can page your doctor at the number below.    Please note that while we do our best to be available for urgent issues outside of office hours, we are not available 24/7.   If you have an urgent issue and are unable to reach us, you may choose to seek medical care at your doctor's office, retail clinic, urgent care center, or emergency room.  If you have a medical emergency, please immediately call 911 or go to the  emergency department.  Pager Numbers  - Dr. Kowalski: [phone]  - Dr. Moye: [phone]  - Dr. Stewart: [phone]  In the event of inclement weather, please call our main line at [phone] for an update on the status of any delays or closures.  Dermatology Medication Tips: Please keep the boxes that topical medications come in in order to help keep track of the instructions about where and how to use these. Pharmacies typically print the medication instructions only on the boxes and not directly on the medication tubes.   If your medication is too expensive, please contact our office at [phone] option 4 or send us a message through MyChart.   We are unable to tell what your co-pay for medications will be in advance as this is different depending on your insurance coverage. However, we may be able to find a substitute medication at lower cost or fill out paperwork to get insurance to cover a needed medication.   If a prior authorization is required to get your medication covered by your insurance company, please allow us 1-2 business days to complete this process.  Drug prices often vary depending on where the prescription is filled and some pharmacies may offer cheaper prices.  The website www.goodrx.com contains coupons for medications through different pharmacies. The prices here do not account for what the cost may be with help from insurance (it may be cheaper with your insurance), but the website can   give you the price if you did not use any insurance.  - You can print the associated coupon and take it with your prescription to the pharmacy.  - You may also stop by our office during regular business hours and pick up a GoodRx coupon card.  - If you need your prescription sent electronically to a different pharmacy, notify our office through [facility] MyChart or by phone at [phone] option 4.     Si Usted Necesita Algo Despus de Su Visita  Tambin puede  enviarnos un mensaje a travs de MyChart. Por lo general respondemos a los mensajes de MyChart en el transcurso de 1 a 2 das hbiles.  Para renovar recetas, por favor pida a su farmacia que se ponga en contacto con nuestra oficina. Nuestro nmero de fax es el [phone].  Si tiene un asunto urgente cuando la clnica est cerrada y que no puede esperar hasta el siguiente da hbil, puede llamar/localizar a su doctor(a) al nmero que aparece a continuacin.   Por favor, tenga en cuenta que aunque hacemos todo lo posible para estar disponibles para asuntos urgentes fuera del horario de oficina, no estamos disponibles las 24 horas del da, los 7 das de la semana.   Si tiene un problema urgente y no puede comunicarse con nosotros, puede optar por buscar atencin mdica  en el consultorio de su doctor(a), en una clnica privada, en un centro de atencin urgente o en una sala de emergencias.  Si tiene una emergencia mdica, por favor llame inmediatamente al 911 o vaya a la sala de emergencias.  Nmeros de bper  - Dr. Kowalski: [phone]  - Dra. Moye: [phone]  - Dra. Stewart: [phone]  En caso de inclemencias del tiempo, por favor llame a nuestra lnea principal al [phone] para una actualizacin sobre el estado de cualquier retraso o cierre.  Consejos para la medicacin en dermatologa: Por favor, guarde las cajas en las que vienen los medicamentos de uso tpico para ayudarle a seguir las instrucciones sobre dnde y cmo usarlos. Las farmacias generalmente imprimen las instrucciones del medicamento slo en las cajas y no directamente en los tubos del medicamento.   Si su medicamento es muy caro, por favor, pngase en contacto con nuestra oficina llamando al [phone] y presione la opcin 4 o envenos un mensaje a travs de MyChart.   No podemos decirle cul ser su copago por los medicamentos por adelantado ya que esto es diferente dependiendo de la cobertura de su seguro.  Sin embargo, es posible que podamos encontrar un medicamento sustituto a menor costo o llenar un formulario para que el seguro cubra el medicamento que se considera necesario.   Si se requiere una autorizacin previa para que su compaa de seguros cubra su medicamento, por favor permtanos de 1 a 2 das hbiles para completar este proceso.  Los precios de los medicamentos varan con frecuencia dependiendo del lugar de dnde se surte la receta y alguna farmacias pueden ofrecer precios ms baratos.  El sitio web www.goodrx.com tiene cupones para medicamentos de diferentes farmacias. Los precios aqu no tienen en cuenta lo que podra costar con la ayuda del seguro (puede ser ms barato con su seguro), pero el sitio web puede darle el precio si no utiliz ningn seguro.  - Puede imprimir el cupn correspondiente y llevarlo con su receta a la farmacia.  - Tambin puede pasar por nuestra oficina durante el horario de atencin regular y recoger una tarjeta de cupones de GoodRx.  -   Si necesita que su receta se enve electrnicamente a una farmacia diferente, informe a nuestra oficina a travs de MyChart de [facility] o por telfono llamando al [phone] y presione la opcin 4.  

## 2022-04-22 ENCOUNTER — Encounter: Payer: Self-pay | Admitting: Dermatology

## 2023-02-19 ENCOUNTER — Encounter: Payer: Self-pay | Admitting: Dermatology

## 2023-02-19 ENCOUNTER — Ambulatory Visit: Payer: 59 | Admitting: Dermatology

## 2023-02-19 VITALS — BP 125/77

## 2023-02-19 DIAGNOSIS — B079 Viral wart, unspecified: Secondary | ICD-10-CM

## 2023-02-19 DIAGNOSIS — Z872 Personal history of diseases of the skin and subcutaneous tissue: Secondary | ICD-10-CM

## 2023-02-19 DIAGNOSIS — D229 Melanocytic nevi, unspecified: Secondary | ICD-10-CM

## 2023-02-19 DIAGNOSIS — D692 Other nonthrombocytopenic purpura: Secondary | ICD-10-CM

## 2023-02-19 DIAGNOSIS — L821 Other seborrheic keratosis: Secondary | ICD-10-CM

## 2023-02-19 DIAGNOSIS — L570 Actinic keratosis: Secondary | ICD-10-CM

## 2023-02-19 DIAGNOSIS — D1801 Hemangioma of skin and subcutaneous tissue: Secondary | ICD-10-CM

## 2023-02-19 DIAGNOSIS — L820 Inflamed seborrheic keratosis: Secondary | ICD-10-CM

## 2023-02-19 DIAGNOSIS — W908XXA Exposure to other nonionizing radiation, initial encounter: Secondary | ICD-10-CM

## 2023-02-19 DIAGNOSIS — Z1283 Encounter for screening for malignant neoplasm of skin: Secondary | ICD-10-CM

## 2023-02-19 DIAGNOSIS — X32XXXA Exposure to sunlight, initial encounter: Secondary | ICD-10-CM

## 2023-02-19 DIAGNOSIS — L814 Other melanin hyperpigmentation: Secondary | ICD-10-CM

## 2023-02-19 DIAGNOSIS — Z86018 Personal history of other benign neoplasm: Secondary | ICD-10-CM

## 2023-02-19 DIAGNOSIS — L578 Other skin changes due to chronic exposure to nonionizing radiation: Secondary | ICD-10-CM

## 2023-02-19 DIAGNOSIS — Z7189 Other specified counseling: Secondary | ICD-10-CM

## 2023-02-19 NOTE — Patient Instructions (Addendum)
Cryotherapy Aftercare  Wash gently with soap and water everyday.   Apply Vaseline and Band-Aid daily until healed.     Due to recent changes in healthcare laws, you may see results of your pathology and/or laboratory studies on MyChart before the doctors have had a chance to review them. We understand that in some cases there may be results that are confusing or concerning to you. Please understand that not all results are received at the same time and often the doctors may need to interpret multiple results in order to provide you with the best plan of care or course of treatment. Therefore, we ask that you please give us 2 business days to thoroughly review all your results before contacting the office for clarification. Should we see a critical lab result, you will be contacted sooner.   If You Need Anything After Your Visit  If you have any questions or concerns for your doctor, please call our main line at [phone] and press option 4 to reach your doctor's medical assistant. If no one answers, please leave a voicemail as directed and we will return your call as soon as possible. Messages left after 4 pm will be answered the following business day.   You may also send us a message via MyChart. We typically respond to MyChart messages within 1-2 business days.  For prescription refills, please ask your pharmacy to contact our office. Our fax number is [phone].  If you have an urgent issue when the clinic is closed that cannot wait until the next business day, you can page your doctor at the number below.    Please note that while we do our best to be available for urgent issues outside of office hours, we are not available 24/7.   If you have an urgent issue and are unable to reach us, you may choose to seek medical care at your doctor's office, retail clinic, urgent care center, or emergency room.  If you have a medical emergency, please immediately call 911 or go to the  emergency department.  Pager Numbers  - Dr. Kowalski: [phone]  - Dr. Moye: [phone]  - Dr. Stewart: [phone]  In the event of inclement weather, please call our main line at [phone] for an update on the status of any delays or closures.  Dermatology Medication Tips: Please keep the boxes that topical medications come in in order to help keep track of the instructions about where and how to use these. Pharmacies typically print the medication instructions only on the boxes and not directly on the medication tubes.   If your medication is too expensive, please contact our office at [phone] option 4 or send us a message through MyChart.   We are unable to tell what your co-pay for medications will be in advance as this is different depending on your insurance coverage. However, we may be able to find a substitute medication at lower cost or fill out paperwork to get insurance to cover a needed medication.   If a prior authorization is required to get your medication covered by your insurance company, please allow us 1-2 business days to complete this process.  Drug prices often vary depending on where the prescription is filled and some pharmacies may offer cheaper prices.  The website www.goodrx.com contains coupons for medications through different pharmacies. The prices here do not account for what the cost may be with help from insurance (it may be cheaper with your insurance), but the website can   give you the price if you did not use any insurance.  - You can print the associated coupon and take it with your prescription to the pharmacy.  - You may also stop by our office during regular business hours and pick up a GoodRx coupon card.  - If you need your prescription sent electronically to a different pharmacy, notify our office through  MyChart or by phone at [phone] option 4.     Si Usted Necesita Algo Despus de Su Visita  Tambin puede  enviarnos un mensaje a travs de MyChart. Por lo general respondemos a los mensajes de MyChart en el transcurso de 1 a 2 das hbiles.  Para renovar recetas, por favor pida a su farmacia que se ponga en contacto con nuestra oficina. Nuestro nmero de fax es el [phone].  Si tiene un asunto urgente cuando la clnica est cerrada y que no puede esperar hasta el siguiente da hbil, puede llamar/localizar a su doctor(a) al nmero que aparece a continuacin.   Por favor, tenga en cuenta que aunque hacemos todo lo posible para estar disponibles para asuntos urgentes fuera del horario de oficina, no estamos disponibles las 24 horas del da, los 7 das de la semana.   Si tiene un problema urgente y no puede comunicarse con nosotros, puede optar por buscar atencin mdica  en el consultorio de su doctor(a), en una clnica privada, en un centro de atencin urgente o en una sala de emergencias.  Si tiene una emergencia mdica, por favor llame inmediatamente al 911 o vaya a la sala de emergencias.  Nmeros de bper  - Dr. Kowalski: [phone]  - Dra. Moye: [phone]  - Dra. Stewart: [phone]  En caso de inclemencias del tiempo, por favor llame a nuestra lnea principal al [phone] para una actualizacin sobre el estado de cualquier retraso o cierre.  Consejos para la medicacin en dermatologa: Por favor, guarde las cajas en las que vienen los medicamentos de uso tpico para ayudarle a seguir las instrucciones sobre dnde y cmo usarlos. Las farmacias generalmente imprimen las instrucciones del medicamento slo en las cajas y no directamente en los tubos del medicamento.   Si su medicamento es muy caro, por favor, pngase en contacto con nuestra oficina llamando al [phone] y presione la opcin 4 o envenos un mensaje a travs de MyChart.   No podemos decirle cul ser su copago por los medicamentos por adelantado ya que esto es diferente dependiendo de la cobertura de su seguro.  Sin embargo, es posible que podamos encontrar un medicamento sustituto a menor costo o llenar un formulario para que el seguro cubra el medicamento que se considera necesario.   Si se requiere una autorizacin previa para que su compaa de seguros cubra su medicamento, por favor permtanos de 1 a 2 das hbiles para completar este proceso.  Los precios de los medicamentos varan con frecuencia dependiendo del lugar de dnde se surte la receta y alguna farmacias pueden ofrecer precios ms baratos.  El sitio web www.goodrx.com tiene cupones para medicamentos de diferentes farmacias. Los precios aqu no tienen en cuenta lo que podra costar con la ayuda del seguro (puede ser ms barato con su seguro), pero el sitio web puede darle el precio si no utiliz ningn seguro.  - Puede imprimir el cupn correspondiente y llevarlo con su receta a la farmacia.  - Tambin puede pasar por nuestra oficina durante el horario de atencin regular y recoger una tarjeta de cupones de GoodRx.  -   Si necesita que su receta se enve electrnicamente a una farmacia diferente, informe a nuestra oficina a travs de MyChart [facility] o por telfono llamando al [phone] y presione la opcin 4.  

## 2023-02-19 NOTE — Progress Notes (Signed)
Follow-Up Visit   Subjective  Alejandra Ward is a 67 y.o. female who presents for the following: Skin Cancer Screening and Full Body Skin Exam, hx of Dysplastic Nevi, Aks, check spots L temple scratches at it, check spot scaly spot nose, hx of wart L 2nd toe  The patient presents for Total-Body Skin Exam (TBSE) for skin cancer screening and mole check. The patient has spots, moles and lesions to be evaluated, some may be new or changing and the patient has concerns that these could be cancer.    The following portions of the chart were reviewed this encounter and updated as appropriate: medications, allergies, medical history  Review of Systems:  No other skin or systemic complaints except as noted in HPI or Assessment and Plan.  Objective  Well appearing patient in no apparent distress; mood and affect are within normal limits.  A full examination was performed including scalp, head, eyes, ears, nose, lips, neck, chest, axillae, abdomen, back, buttocks, bilateral upper extremities, bilateral lower extremities, hands, feet, fingers, toes, fingernails, and toenails. All findings within normal limits unless otherwise noted below.   Relevant physical exam findings are noted in the Assessment and Plan.  L temple x 1, L nose x 2 (3) Pink scaly macules  R cheek x 1 Stuck on waxy paps with erythema  L dorsum 2nd toe x 1 Verrucous papules -- Discussed viral etiology and contagion.     Assessment & Plan   LENTIGINES, SEBORRHEIC KERATOSES, HEMANGIOMAS - Benign normal skin lesions - Benign-appearing - Call for any changes  MELANOCYTIC NEVI - Tan-brown and/or pink-flesh-colored symmetric macules and papules - Benign appearing on exam today - Observation - Call clinic for new or changing moles - Recommend daily use of broad spectrum spf 30+ sunscreen to sun-exposed areas.   ACTINIC DAMAGE - Chronic condition, secondary to cumulative UV/sun exposure - diffuse scaly  erythematous macules with underlying dyspigmentation - Recommend daily broad spectrum sunscreen SPF 30+ to sun-exposed areas, reapply every 2 hours as needed.  - Staying in the shade or wearing long sleeves, sun glasses (UVA+UVB protection) and wide brim hats (4-inch brim around the entire circumference of the hat) are also recommended for sun protection.  - Call for new or changing lesions.  SKIN CANCER SCREENING PERFORMED TODAY.  HISTORY OF DYSPLASTIC NEVUS No evidence of recurrence today Recommend regular full body skin exams Recommend daily broad spectrum sunscreen SPF 30+ to sun-exposed areas, reapply every 2 hours as needed.  Call if any new or changing lesions are noted between office visits  - LUQA Costal area  Purpura - Chronic; persistent and recurrent.  Treatable, but not curable. - Violaceous macules and patches - Benign - Related to trauma, age, sun damage and/or use of blood thinners, chronic use of topical and/or oral steroids - Observe - Can use OTC arnica containing moisturizer such as Dermend Bruise Formula if desired - Call for worsening or other concerns   AK (actinic keratosis) (3) L temple x 1, L nose x 2  Destruction of lesion - L temple x 1, L nose x 2 Complexity: simple   Destruction method: cryotherapy   Informed consent: discussed and consent obtained   Timeout:  patient name, date of birth, surgical site, and procedure verified Lesion destroyed using liquid nitrogen: Yes   Region frozen until ice ball extended beyond lesion: Yes   Outcome: patient tolerated procedure well with no complications   Post-procedure details: wound care instructions given    Inflamed  seborrheic keratosis R cheek x 1  Symptomatic, irritating, patient would like treated.   Destruction of lesion - R cheek x 1 Complexity: simple   Destruction method: cryotherapy   Informed consent: discussed and consent obtained   Timeout:  patient name, date of birth, surgical site, and  procedure verified Lesion destroyed using liquid nitrogen: Yes   Region frozen until ice ball extended beyond lesion: Yes   Outcome: patient tolerated procedure well with no complications   Post-procedure details: wound care instructions given    Viral warts, unspecified type L dorsum 2nd toe x 1  Viral Wart (HPV) Counseling  Discussed viral / HPV (Human Papilloma Virus) etiology and risk of spread /infectivity to other areas of body as well as to other people.  Multiple treatments and methods may be required to clear warts and it is possible treatment may not be successful.  Treatment risks include discoloration; scarring and there is still potential for wart recurrence.  Destruction of lesion - L dorsum 2nd toe x 1 Complexity: simple   Destruction method: cryotherapy   Informed consent: discussed and consent obtained   Timeout:  patient name, date of birth, surgical site, and procedure verified Lesion destroyed using liquid nitrogen: Yes   Region frozen until ice ball extended beyond lesion: Yes   Outcome: patient tolerated procedure well with no complications   Post-procedure details: wound care instructions given     Return in about 1 year (around 02/19/2024) for TBSE, Hx of Dysplastic nevi, Hx of AKs.  I, Ardis Rowan, RMA, am acting as scribe for Armida Sans, MD .   Documentation: I have reviewed the above documentation for accuracy and completeness, and I agree with the above.  Armida Sans, MD

## 2023-02-21 ENCOUNTER — Encounter: Payer: Self-pay | Admitting: Dermatology

## 2023-05-21 ENCOUNTER — Ambulatory Visit: Payer: 59 | Admitting: Dermatology

## 2024-02-25 ENCOUNTER — Ambulatory Visit: Payer: 59 | Admitting: Dermatology

## 2024-03-28 ENCOUNTER — Ambulatory Visit: Admitting: Dermatology

## 2024-03-28 DIAGNOSIS — L570 Actinic keratosis: Secondary | ICD-10-CM | POA: Diagnosis not present

## 2024-03-28 DIAGNOSIS — L820 Inflamed seborrheic keratosis: Secondary | ICD-10-CM | POA: Diagnosis not present

## 2024-03-28 DIAGNOSIS — L578 Other skin changes due to chronic exposure to nonionizing radiation: Secondary | ICD-10-CM | POA: Diagnosis not present

## 2024-03-28 DIAGNOSIS — L814 Other melanin hyperpigmentation: Secondary | ICD-10-CM

## 2024-03-28 DIAGNOSIS — D229 Melanocytic nevi, unspecified: Secondary | ICD-10-CM

## 2024-03-28 DIAGNOSIS — Z1283 Encounter for screening for malignant neoplasm of skin: Secondary | ICD-10-CM | POA: Diagnosis not present

## 2024-03-28 DIAGNOSIS — D1801 Hemangioma of skin and subcutaneous tissue: Secondary | ICD-10-CM

## 2024-03-28 DIAGNOSIS — L821 Other seborrheic keratosis: Secondary | ICD-10-CM

## 2024-03-28 DIAGNOSIS — W57XXXD Bitten or stung by nonvenomous insect and other nonvenomous arthropods, subsequent encounter: Secondary | ICD-10-CM

## 2024-03-28 DIAGNOSIS — Z86018 Personal history of other benign neoplasm: Secondary | ICD-10-CM

## 2024-03-28 DIAGNOSIS — W908XXA Exposure to other nonionizing radiation, initial encounter: Secondary | ICD-10-CM

## 2024-03-28 DIAGNOSIS — L304 Erythema intertrigo: Secondary | ICD-10-CM

## 2024-03-28 DIAGNOSIS — B079 Viral wart, unspecified: Secondary | ICD-10-CM

## 2024-03-28 DIAGNOSIS — D692 Other nonthrombocytopenic purpura: Secondary | ICD-10-CM

## 2024-03-28 DIAGNOSIS — Z87828 Personal history of other (healed) physical injury and trauma: Secondary | ICD-10-CM

## 2024-03-28 MED ORDER — TRIAMCINOLONE ACETONIDE 0.1 % EX CREA: TOPICAL_CREAM | CUTANEOUS | 2 refills | Status: AC

## 2024-03-28 NOTE — Patient Instructions (Addendum)
 For bug bites  Triamcinolone  0.1 % cream apply topically to affected rash or bug bites daily to twice daily as needed.  Avoid applying to face, groin, and axilla. Use as directed. Long-term use can cause thinning of the skin.  Topical steroids (such as triamcinolone , fluocinolone, fluocinonide, mometasone, clobetasol, halobetasol, betamethasone, hydrocortisone) can cause thinning and lightening of the skin if they are used for too long in the same area. Your physician has selected the right strength medicine for your problem and area affected on the body. Please use your medication only as directed by your physician to prevent side effects.     For wart   Discussed viral / HPV (Human Papilloma Virus) etiology and risk of spread /infectivity to other areas of body as well as to other people.  Multiple treatments and methods may be required to clear warts and it is possible treatment may not be successful.  Treatment risks include discoloration; scarring and there is still potential for wart recurrence.  Start prescription 5-fluorouracil/salicylic acid wart paste from [facility] nightly under occlusion. Cover with tape. Wash off next morning and can cover. Reapply nightly.  Reviewed risk of irritation and risk scarring if applied to normal skin. If irritation develops, stop medication for a few days until area calm, then restart a very small amount just to the wart. This medication cannot be used by pregnant women. Patient advised they will receive an email from the [facility] pharmacy and can purchase the medication online through a link in the email.  Instructions for [facility] Medications  One or more of your medications was sent to the [facility] mail order compounding pharmacy. You will receive an email from them and can purchase the medicine through that link. It will then be mailed to your home at the address you confirmed. If for any reason you do not receive an email  from them, please check your spam folder. If you still do not find the email, please let us  know. [facility] phone number is [phone].      Actinic keratoses are precancerous spots that appear secondary to cumulative UV radiation exposure/sun exposure over time. They are chronic with expected duration over 1 year. A portion of actinic keratoses will progress to squamous cell carcinoma of the skin. It is not possible to reliably predict which spots will progress to skin cancer and so treatment is recommended to prevent development of skin cancer.  Recommend daily broad spectrum sunscreen SPF 30+ to sun-exposed areas, reapply every 2 hours as needed.  Recommend staying in the shade or wearing long sleeves, sun glasses (UVA+UVB protection) and wide brim hats (4-inch brim around the entire circumference of the hat). Call for new or changing lesions.    Cryotherapy Aftercare  Wash gently with soap and water everyday.   Apply Vaseline and Band-Aid daily until healed.     Seborrheic Keratosis  What causes seborrheic keratoses? Seborrheic keratoses are harmless, common skin growths that first appear during adult life.  As time goes by, more growths appear.  Some people may develop a large number of them.  Seborrheic keratoses appear on both covered and uncovered body parts.  They are not caused by sunlight.  The tendency to develop seborrheic keratoses can be inherited.  They vary in color from skin-colored to gray, brown, or even black.  They can be either smooth or have a rough, warty surface.   Seborrheic keratoses are superficial and look as if they were stuck on the  skin.  Under the microscope this type of keratosis looks like layers upon layers of skin.  That is why at times the top layer may seem to fall off, but the rest of the growth remains and re-grows.    Treatment Seborrheic keratoses do not need to be treated, but can easily be removed in the office.  Seborrheic keratoses  often cause symptoms when they rub on clothing or jewelry.  Lesions can be in the way of shaving.  If they become inflamed, they can cause itching, soreness, or burning.  Removal of a seborrheic keratosis can be accomplished by freezing, burning, or surgery. If any spot bleeds, scabs, or grows rapidly, please return to have it checked, as these can be an indication of a skin cancer.    Melanoma ABCDEs  Melanoma is the most dangerous type of skin cancer, and is the leading cause of death from skin disease.  You are more likely to develop melanoma if you: Have light-colored skin, light-colored eyes, or red or blond hair Spend a lot of time in the sun Tan regularly, either outdoors or in a tanning bed Have had blistering sunburns, especially during childhood Have a close family member who has had a melanoma Have atypical moles or large birthmarks  Early detection of melanoma is key since treatment is typically straightforward and cure rates are extremely high if we catch it early.   The first sign of melanoma is often a change in a mole or a new dark spot.  The ABCDE system is a way of remembering the signs of melanoma.  A for asymmetry:  The two halves do not match. B for border:  The edges of the growth are irregular. C for color:  A mixture of colors are present instead of an even brown color. D for diameter:  Melanomas are usually (but not always) greater than 6mm - the size of a pencil eraser. E for evolution:  The spot keeps changing in size, shape, and color.  Please check your skin once per month between visits. You can use a small mirror in front and a large mirror behind you to keep an eye on the back side or your body.   If you see any new or changing lesions before your next follow-up, please call to schedule a visit.  Please continue daily skin protection including broad spectrum sunscreen SPF 30+ to sun-exposed areas, reapplying every 2 hours as needed when you're outdoors.    Staying in the shade or wearing long sleeves, sun glasses (UVA+UVB protection) and wide brim hats (4-inch brim around the entire circumference of the hat) are also recommended for sun protection.    Due to recent changes in healthcare laws, you may see results of your pathology and/or laboratory studies on MyChart before the doctors have had a chance to review them. We understand that in some cases there may be results that are confusing or concerning to you. Please understand that not all results are received at the same time and often the doctors may need to interpret multiple results in order to provide you with the best plan of care or course of treatment. Therefore, we ask that you please give us  2 business days to thoroughly review all your results before contacting the office for clarification. Should we see a critical lab result, you will be contacted sooner.   If You Need Anything After Your Visit  If you have any questions or concerns for your doctor, please call our  main line at [phone] and press option 4 to reach your doctor's medical assistant. If no one answers, please leave a voicemail as directed and we will return your call as soon as possible. Messages left after 4 pm will be answered the following business day.   You may also send us  a message via MyChart. We typically respond to MyChart messages within 1-2 business days.  For prescription refills, please ask your pharmacy to contact our office. Our fax number is [phone].  If you have an urgent issue when the clinic is closed that cannot wait until the next business day, you can page your doctor at the number below.    Please note that while we do our best to be available for urgent issues outside of office hours, we are not available 24/7.   If you have an urgent issue and are unable to reach us , you may choose to seek medical care at your doctor's office, retail clinic, urgent care center, or emergency room.  If  you have a medical emergency, please immediately call 911 or go to the emergency department.  Pager Numbers  - Dr. Hester: [phone]  - Dr. Jackquline: [phone]  - Dr. Claudene: [phone]   In the event of inclement weather, please call our main line at [phone] for an update on the status of any delays or closures.  Dermatology Medication Tips: Please keep the boxes that topical medications come in in order to help keep track of the instructions about where and how to use these. Pharmacies typically print the medication instructions only on the boxes and not directly on the medication tubes.   If your medication is too expensive, please contact our office at [phone] option 4 or send us  a message through MyChart.   We are unable to tell what your co-pay for medications will be in advance as this is different depending on your insurance coverage. However, we may be able to find a substitute medication at lower cost or fill out paperwork to get insurance to cover a needed medication.   If a prior authorization is required to get your medication covered by your insurance company, please allow us  1-2 business days to complete this process.  Drug prices often vary depending on where the prescription is filled and some pharmacies may offer cheaper prices.  The website www.goodrx.com contains coupons for medications through different pharmacies. The prices here do not account for what the cost may be with help from insurance (it may be cheaper with your insurance), but the website can give you the price if you did not use any insurance.  - You can print the associated coupon and take it with your prescription to the pharmacy.  - You may also stop by our office during regular business hours and pick up a GoodRx coupon card.  - If you need your prescription sent electronically to a different pharmacy, notify our office through [facility] or by phone at [phone] option  4.     Si Usted Necesita Algo Despus de Su Visita  Tambin puede enviarnos un mensaje a travs de Clinical cytogeneticist. Por lo general respondemos a los mensajes de MyChart en el transcurso de 1 a 2 das hbiles.  Para renovar recetas, por favor pida a su farmacia que se ponga en contacto con nuestra oficina. Randi lakes de fax es [place] [phone].  Si tiene un asunto urgente cuando la clnica est cerrada y que no puede esperar hasta el siguiente da hbil, [place]  llamar/localizar a su doctor(a) al nmero que aparece a continuacin.   Por favor, tenga en cuenta que aunque hacemos todo lo posible para estar disponibles para asuntos urgentes fuera del horario de [place], no estamos disponibles las 24 horas del da, los 7 [address]  [address] de la [place].   Si tiene un problema urgente y no puede comunicarse con nosotros, puede optar por buscar atencin mdica  en el consultorio de su doctor(a), en una clnica privada, en un centro de atencin urgente o en una sala de emergencias.  Si tiene Engineer, drilling, por favor llame inmediatamente al 911 o vaya a la sala de emergencias.  Nmeros de bper  - Dr. Hester: [phone]  - Dra. Jackquline: [phone]  - Dr. Claudene: [phone]   En caso de inclemencias del tiempo, por favor llame a landry capes principal al [phone] para una actualizacin sobre el [place] de cualquier retraso o cierre.  Consejos para la medicacin en dermatologa: Por favor, guarde las cajas en las que vienen los medicamentos de uso tpico para ayudarle a seguir las instrucciones sobre dnde y cmo usarlos. Las farmacias generalmente imprimen las instrucciones del medicamento slo en las cajas y no directamente en los tubos del [place].   Si su medicamento es muy caro, por favor, pngase en contacto con landry rieger llamando al [phone] y presione la opcin 4 o envenos un mensaje a travs de Clinical cytogeneticist.   No podemos decirle cul ser su copago por los medicamentos por  adelantado ya que esto es diferente dependiendo de la cobertura de su seguro. Sin embargo, es posible que podamos encontrar un medicamento sustituto a Audiological scientist un formulario para que el seguro cubra el medicamento que se considera necesario.   Si se requiere una autorizacin previa para que su compaa de seguros malta su medicamento, por favor permtanos de 1 a 2 das hbiles para completar este proceso.  Los precios de los medicamentos varan con frecuencia dependiendo del Environmental consultant de dnde se surte la receta y alguna farmacias pueden ofrecer precios ms baratos.  El sitio web www.goodrx.com tiene cupones para medicamentos de Health and safety inspector. Los precios aqu no tienen en cuenta lo que podra costar con la ayuda del seguro (puede ser ms barato con su seguro), pero el sitio web puede darle el precio si no utiliz Tourist information centre manager.  - Puede imprimir el cupn correspondiente y llevarlo con su receta a la farmacia.  - Tambin puede pasar por nuestra oficina durante el horario de atencin regular y Education officer, museum una tarjeta de cupones de GoodRx.  - Si necesita que su receta se enve electrnicamente a una farmacia diferente, informe a nuestra oficina a travs de MyChart de [place] o por telfono llamando al [phone] y presione la opcin 4.

## 2024-03-28 NOTE — Progress Notes (Unsigned)
 Follow-Up Visit   Subjective  Alejandra Ward is a 68 y.o. female who presents for the following: Skin Cancer Screening and Full Body Skin Exam hx of aks and isks, hx of dysplastic nevi, hx of warts at 2nd toe at left foot, hx of intertrigo has used [facility] intertrigo cream to use under breast  Patient would like to have refill of tmc to use for moquito bites   The patient presents for Total-Body Skin Exam (TBSE) for skin cancer screening and mole check. The patient has spots, moles and lesions to be evaluated, some may be new or changing and the patient may have concern these could be cancer.  The following portions of the chart were reviewed this encounter and updated as appropriate: medications, allergies, medical history  Review of Systems:  No other skin or systemic complaints except as noted in HPI or Assessment and Plan.  Objective  Well appearing patient in no apparent distress; mood and affect are within normal limits.  A full examination was performed including scalp, head, eyes, ears, nose, lips, neck, chest, axillae, abdomen, back, buttocks, bilateral upper extremities, bilateral lower extremities, hands, feet, fingers, toes, fingernails, and toenails. All findings within normal limits unless otherwise noted below.   Relevant physical exam findings are noted in the Assessment and Plan.  left nose x 1 Erythematous thin papules/macules with gritty scale.  left medial calf x 1 Erythematous stuck-on, waxy papule or plaque left 2nd toe x 1 Verrucous papules -- Discussed viral etiology and contagion.   Assessment & Plan   SKIN CANCER SCREENING PERFORMED TODAY.  ACTINIC DAMAGE - Chronic condition, secondary to cumulative UV/sun exposure - diffuse scaly erythematous macules with underlying dyspigmentation - Recommend daily broad spectrum sunscreen SPF 30+ to sun-exposed areas, reapply every 2 hours as needed.  - Staying in the shade or wearing long sleeves, sun  glasses (UVA+UVB protection) and wide brim hats (4-inch brim around the entire circumference of the hat) are also recommended for sun protection.  - Call for new or changing lesions.  LENTIGINES, SEBORRHEIC KERATOSES, HEMANGIOMAS - Benign normal skin lesions - Benign-appearing - Call for any changes  MELANOCYTIC NEVI - Tan-brown and/or pink-flesh-colored symmetric macules and papules - Benign appearing on exam today - Observation - Call clinic for new or changing moles - Recommend daily use of broad spectrum spf 30+ sunscreen to sun-exposed areas.   Purpura - Chronic; persistent and recurrent.  Treatable, but not curable. - Violaceous macules and patches - Benign - Related to trauma, age, sun damage and/or use of blood thinners, chronic use of topical and/or oral steroids - Observe - Can use OTC arnica containing moisturizer such as Dermend Bruise Formula if desired - Call for worsening or other concerns  History of bug bites  Exam: clear today Treatment Plan: Start tmc 0.1 % cream apply qd/bid prn to affected areas Avoid applying to face, groin, and axilla. Use as directed. Long-term use can cause thinning of the skin. Topical steroids (such as triamcinolone , fluocinolone, fluocinonide, mometasone, clobetasol, halobetasol, betamethasone, hydrocortisone) can cause thinning and lightening of the skin if they are used for too long in the same area. Your physician has selected the right strength medicine for your problem and area affected on the body. Please use your medication only as directed by your physician to prevent side effects.    HISTORY OF DYSPLASTIC NEVUS 11/03/2019 LU[place] costal area - moderate  No evidence of recurrence today Recommend regular full body skin exams Recommend daily  broad spectrum sunscreen SPF 30+ to sun-exposed areas, reapply every 2 hours as needed.  Call if any new or changing lesions are noted between office visits  ACTINIC KERATOSIS left nose x  1 Actinic keratoses are precancerous spots that appear secondary to cumulative UV radiation exposure/sun exposure over time. They are chronic with expected duration over 1 year. A portion of actinic keratoses will progress to squamous cell carcinoma of the skin. It is not possible to reliably predict which spots will progress to skin cancer and so treatment is recommended to prevent development of skin cancer.  Recommend daily broad spectrum sunscreen SPF 30+ to sun-exposed areas, reapply every 2 hours as needed.  Recommend staying in the shade or wearing long sleeves, sun glasses (UVA+UVB protection) and wide brim hats (4-inch brim around the entire circumference of the hat). Call for new or changing lesions. Destruction of lesion - left nose x 1 Complexity: simple   Destruction method: cryotherapy   Informed consent: discussed and consent obtained   Timeout:  patient name, date of birth, surgical site, and procedure verified Lesion destroyed using liquid nitrogen: Yes   Region frozen until ice ball extended beyond lesion: Yes   Outcome: patient tolerated procedure well with no complications   Post-procedure details: wound care instructions given    INFLAMED SEBORRHEIC KERATOSIS left medial calf x 1 Symptomatic, irritating, patient would like treated. Destruction of lesion - left medial calf x 1 Complexity: simple   Destruction method: cryotherapy   Informed consent: discussed and consent obtained   Timeout:  patient name, date of birth, surgical site, and procedure verified Lesion destroyed using liquid nitrogen: Yes   Region frozen until ice ball extended beyond lesion: Yes   Outcome: patient tolerated procedure well with no complications   Post-procedure details: wound care instructions given    VIRAL WARTS, UNSPECIFIED TYPE left 2nd toe x 1 Viral Wart (HPV) Counseling  Discussed viral / HPV (Human Papilloma Virus) etiology and risk of spread /infectivity to other areas of body as  well as to other people.  Multiple treatments and methods may be required to clear warts and it is possible treatment may not be successful.  Treatment risks include discoloration; scarring and there is still potential for wart recurrence.  Treated with Ln2 today   Start prescription 5-fluorouracil/salicylic acid wart paste from [facility] nightly under occlusion. Reviewed risk of irritation and risk scarring if applied to normal skin. If irritation develops, stop medication for a few days until area calm, then restart a very small amount just to the wart. This medication cannot be used by pregnant women. Patient advised they will receive an email from the [facility] pharmacy and can purchase the medication online through a link in the email.  Destruction of lesion - left 2nd toe x 1 Complexity: simple   Destruction method: cryotherapy   Informed consent: discussed and consent obtained   Timeout:  patient name, date of birth, surgical site, and procedure verified Lesion destroyed using liquid nitrogen: Yes   Region frozen until ice ball extended beyond lesion: Yes   Outcome: patient tolerated procedure well with no complications   Post-procedure details: wound care instructions given    ERYTHEMA INTERTRIGO   Related Medications triamcinolone  cream (KENALOG ) 0.1 % Apply topically to rash/bug bites qd/bid prn Avoid applying to face, groin, and axilla. Use as directed. Long-term use can cause thinning of the skin. Return in about 1 year (around 03/28/2025) for TBSE.  LILLETTE Eleanor Blush, CMA, am  acting as scribe for Alm Rhyme, MD.   Documentation: I have reviewed the above documentation for accuracy and completeness, and I agree with the above.  Alm Rhyme, MD

## 2024-03-29 ENCOUNTER — Encounter: Payer: Self-pay | Admitting: Dermatology

## 2025-03-28 ENCOUNTER — Ambulatory Visit: Admitting: Dermatology
